# Patient Record
Sex: Male | Born: 1950 | Race: Black or African American | Hispanic: No | Marital: Married | Smoking: Current every day smoker
Health system: Southern US, Community
[De-identification: ages and names within clinical notes are randomized; demographics above are authoritative.]

## PROBLEM LIST (undated history)

## (undated) DIAGNOSIS — B1920 Unspecified viral hepatitis C without hepatic coma: Secondary | ICD-10-CM

## (undated) DIAGNOSIS — N289 Disorder of kidney and ureter, unspecified: Secondary | ICD-10-CM

## (undated) DIAGNOSIS — M25569 Pain in unspecified knee: Secondary | ICD-10-CM

## (undated) DIAGNOSIS — C220 Liver cell carcinoma: Secondary | ICD-10-CM

## (undated) DIAGNOSIS — K769 Liver disease, unspecified: Secondary | ICD-10-CM

## (undated) DIAGNOSIS — I10 Essential (primary) hypertension: Secondary | ICD-10-CM

---

## 2002-05-23 ENCOUNTER — Encounter: Payer: Self-pay | Admitting: Emergency Medicine

## 2002-05-23 ENCOUNTER — Emergency Department (HOSPITAL_COMMUNITY): Admission: EM | Admit: 2002-05-23 | Discharge: 2002-05-23 | Payer: Self-pay | Admitting: Emergency Medicine

## 2009-11-07 ENCOUNTER — Emergency Department (HOSPITAL_COMMUNITY): Admission: EM | Admit: 2009-11-07 | Discharge: 2009-11-07 | Payer: Self-pay | Admitting: Emergency Medicine

## 2011-09-23 ENCOUNTER — Emergency Department (HOSPITAL_COMMUNITY): Payer: Non-veteran care

## 2011-09-23 ENCOUNTER — Inpatient Hospital Stay (HOSPITAL_COMMUNITY)
Admission: EM | Admit: 2011-09-23 | Discharge: 2011-09-28 | DRG: 505 | Disposition: A | Discharge status: Home Health | Payer: Non-veteran care | Attending: Internal Medicine | Admitting: Internal Medicine

## 2011-09-23 ENCOUNTER — Encounter: Payer: Self-pay | Admitting: *Deleted

## 2011-09-23 DIAGNOSIS — M109 Gout, unspecified: Secondary | ICD-10-CM | POA: Diagnosis present

## 2011-09-23 DIAGNOSIS — R52 Pain, unspecified: Secondary | ICD-10-CM

## 2011-09-23 DIAGNOSIS — F1210 Cannabis abuse, uncomplicated: Secondary | ICD-10-CM | POA: Diagnosis present

## 2011-09-23 DIAGNOSIS — F17200 Nicotine dependence, unspecified, uncomplicated: Secondary | ICD-10-CM | POA: Diagnosis present

## 2011-09-23 DIAGNOSIS — R7401 Elevation of levels of liver transaminase levels: Secondary | ICD-10-CM | POA: Diagnosis present

## 2011-09-23 DIAGNOSIS — F1010 Alcohol abuse, uncomplicated: Secondary | ICD-10-CM | POA: Diagnosis present

## 2011-09-23 DIAGNOSIS — M21969 Unspecified acquired deformity of unspecified lower leg: Secondary | ICD-10-CM | POA: Diagnosis present

## 2011-09-23 DIAGNOSIS — K709 Alcoholic liver disease, unspecified: Secondary | ICD-10-CM | POA: Diagnosis present

## 2011-09-23 DIAGNOSIS — M869 Osteomyelitis, unspecified: Principal | ICD-10-CM | POA: Diagnosis present

## 2011-09-23 DIAGNOSIS — B1920 Unspecified viral hepatitis C without hepatic coma: Secondary | ICD-10-CM | POA: Diagnosis present

## 2011-09-23 DIAGNOSIS — K7689 Other specified diseases of liver: Secondary | ICD-10-CM | POA: Diagnosis present

## 2011-09-23 DIAGNOSIS — M25569 Pain in unspecified knee: Secondary | ICD-10-CM

## 2011-09-23 DIAGNOSIS — K769 Liver disease, unspecified: Secondary | ICD-10-CM | POA: Diagnosis present

## 2011-09-23 DIAGNOSIS — M1710 Unilateral primary osteoarthritis, unspecified knee: Secondary | ICD-10-CM | POA: Diagnosis present

## 2011-09-23 DIAGNOSIS — I10 Essential (primary) hypertension: Secondary | ICD-10-CM | POA: Diagnosis present

## 2011-09-23 DIAGNOSIS — R7402 Elevation of levels of lactic acid dehydrogenase (LDH): Secondary | ICD-10-CM | POA: Diagnosis present

## 2011-09-23 HISTORY — DX: Pain in unspecified knee: M25.569

## 2011-09-23 HISTORY — DX: Essential (primary) hypertension: I10

## 2011-09-23 HISTORY — DX: Disorder of kidney and ureter, unspecified: N28.9

## 2011-09-23 HISTORY — DX: Unspecified viral hepatitis C without hepatic coma: B19.20

## 2011-09-23 HISTORY — DX: Liver disease, unspecified: K76.9

## 2011-09-23 LAB — DIFFERENTIAL
Basophils Absolute: 0 10*3/uL (ref 0.0–0.1)
Eosinophils Absolute: 0.1 10*3/uL (ref 0.0–0.7)
Lymphocytes Relative: 17 % (ref 12–46)
Lymphs Abs: 1.5 10*3/uL (ref 0.7–4.0)
Monocytes Relative: 7 % (ref 3–12)
Neutro Abs: 6.9 10*3/uL (ref 1.7–7.7)

## 2011-09-23 LAB — CBC
HCT: 37.5 % — ABNORMAL LOW (ref 39.0–52.0)
HCT: 35.4 % — ABNORMAL LOW (ref 39.0–52.0)
Hemoglobin: 12.1 g/dL — ABNORMAL LOW (ref 13.0–17.0)
MCHC: 33.6 g/dL (ref 30.0–36.0)
Platelets: 107 10*3/uL — ABNORMAL LOW (ref 150–400)
RBC: 3.45 MIL/uL — ABNORMAL LOW (ref 4.22–5.81)
RDW: 14.5 % (ref 11.5–15.5)
WBC: 9.1 10*3/uL (ref 4.0–10.5)
WBC: 9.8 10*3/uL (ref 4.0–10.5)

## 2011-09-23 LAB — COMPREHENSIVE METABOLIC PANEL
Albumin: 2.8 g/dL — ABNORMAL LOW (ref 3.5–5.2)
BUN: 17 mg/dL (ref 6–23)
Calcium: 8.8 mg/dL (ref 8.4–10.5)
GFR calc Af Amer: >90 mL/min (ref >90)
Glucose, Bld: 178 mg/dL — ABNORMAL HIGH (ref 70–99)
Potassium: 4 mEq/L (ref 3.5–5.1)
Total Protein: 7.4 g/dL (ref 6.0–8.3)

## 2011-09-23 LAB — CREATININE, SERUM
Creatinine, Ser: 1 mg/dL (ref 0.50–1.35)
GFR calc Af Amer: >90 mL/min (ref >90)
GFR calc non Af Amer: 80 mL/min — ABNORMAL LOW (ref >90)

## 2011-09-23 LAB — SEDIMENTATION RATE: Sed Rate: 30 mm/hr — ABNORMAL HIGH (ref 0–16)

## 2011-09-23 MED ORDER — LORAZEPAM 2 MG/ML IJ SOLN
1.0000 mg | Freq: Four times a day (QID) | INTRAMUSCULAR | Status: DC | PRN
  Administered 2011-09-23 – 2011-09-26 (×8): 1 mg via INTRAVENOUS
  Filled 2011-09-23 (×9): qty 1

## 2011-09-23 MED ORDER — AMLODIPINE BESYLATE 10 MG PO TABS
10.0000 mg | ORAL_TABLET | ORAL | Status: DC
  Filled 2011-09-23: qty 1

## 2011-09-23 MED ORDER — PIPERACILLIN-TAZOBACTAM 3.375 G IVPB
3.3750 g | Freq: Three times a day (TID) | INTRAVENOUS | Status: DC
  Filled 2011-09-23 (×2): qty 50

## 2011-09-23 MED ORDER — PIPERACILLIN-TAZOBACTAM 3.375 G IVPB
3.3750 g | Freq: Three times a day (TID) | INTRAVENOUS | Status: DC
  Administered 2011-09-24 – 2011-09-28 (×14): 3.375 g via INTRAVENOUS
  Filled 2011-09-23 (×15): qty 50

## 2011-09-23 MED ORDER — SPIRONOLACTONE 25 MG PO TABS
25.0000 mg | ORAL_TABLET | ORAL | Status: AC
  Filled 2011-09-23: qty 1

## 2011-09-23 MED ORDER — COLCHICINE 0.6 MG PO TABS
0.6000 mg | ORAL_TABLET | Freq: Two times a day (BID) | ORAL | Status: AC
  Administered 2011-09-23 – 2011-09-28 (×10): 0.6 mg via ORAL
  Filled 2011-09-23 (×9): qty 1

## 2011-09-23 MED ORDER — SODIUM CHLORIDE 0.9 % IV SOLN
Freq: Once | INTRAVENOUS | Status: AC
  Administered 2011-09-23: 16:00:00 via INTRAVENOUS

## 2011-09-23 MED ORDER — SODIUM CHLORIDE 0.9 % IJ SOLN
3.0000 mL | INTRAMUSCULAR | Status: DC | PRN
  Administered 2011-09-23: 3 mL via INTRAVENOUS

## 2011-09-23 MED ORDER — AMLODIPINE BESYLATE 10 MG PO TABS
10.0000 mg | ORAL_TABLET | Freq: Every day | ORAL | Status: DC
  Administered 2011-09-23 – 2011-09-28 (×6): 10 mg via ORAL
  Filled 2011-09-23 (×6): qty 1

## 2011-09-23 MED ORDER — VANCOMYCIN HCL IN DEXTROSE 1-5 GM/200ML-% IV SOLN
1000.0000 mg | Freq: Two times a day (BID) | INTRAVENOUS | Status: DC
  Administered 2011-09-24 – 2011-09-27 (×7): 1000 mg via INTRAVENOUS
  Filled 2011-09-23 (×10): qty 200

## 2011-09-23 MED ORDER — PREDNISONE 50 MG PO TABS
60.0000 mg | ORAL_TABLET | Freq: Every day | ORAL | Status: DC
  Administered 2011-09-24: 60 mg via ORAL
  Filled 2011-09-23: qty 1

## 2011-09-23 MED ORDER — VANCOMYCIN HCL IN DEXTROSE 1-5 GM/200ML-% IV SOLN
1000.0000 mg | INTRAVENOUS | Status: AC
  Administered 2011-09-23: 1000 mg via INTRAVENOUS
  Filled 2011-09-23: qty 200

## 2011-09-23 MED ORDER — FOLIC ACID 1 MG PO TABS
1.0000 mg | ORAL_TABLET | Freq: Every day | ORAL | Status: DC
  Administered 2011-09-23 – 2011-09-28 (×6): 1 mg via ORAL
  Filled 2011-09-23 (×7): qty 1

## 2011-09-23 MED ORDER — SPIRONOLACTONE 25 MG PO TABS
25.0000 mg | ORAL_TABLET | Freq: Every day | ORAL | Status: DC
  Administered 2011-09-23 – 2011-09-24 (×2): 25 mg via ORAL
  Filled 2011-09-23: qty 1

## 2011-09-23 MED ORDER — ONDANSETRON HCL 4 MG/2ML IJ SOLN
4.0000 mg | Freq: Four times a day (QID) | INTRAMUSCULAR | Status: DC | PRN
  Administered 2011-09-23 (×2): 4 mg via INTRAVENOUS
  Filled 2011-09-23 (×2): qty 2

## 2011-09-23 MED ORDER — NAPROXEN 500 MG PO TABS
500.0000 mg | ORAL_TABLET | Freq: Two times a day (BID) | ORAL | Status: DC
  Administered 2011-09-24 – 2011-09-28 (×9): 500 mg via ORAL
  Filled 2011-09-23 (×14): qty 1

## 2011-09-23 MED ORDER — MORPHINE SULFATE 2 MG/ML IJ SOLN
1.0000 mg | INTRAMUSCULAR | Status: AC | PRN
  Administered 2011-09-23 – 2011-09-25 (×8): 1 mg via INTRAVENOUS
  Filled 2011-09-23 (×8): qty 1

## 2011-09-23 MED ORDER — PIPERACILLIN-TAZOBACTAM 3.375 G IVPB 30 MIN
3.3750 g | INTRAVENOUS | Status: AC
  Administered 2011-09-23: 3.375 g via INTRAVENOUS
  Filled 2011-09-23: qty 50

## 2011-09-23 MED ORDER — HEPARIN SODIUM (PORCINE) 5000 UNIT/ML IJ SOLN
5000.0000 [IU] | Freq: Three times a day (TID) | INTRAMUSCULAR | Status: DC
  Administered 2011-09-24 – 2011-09-26 (×8): 5000 [IU] via SUBCUTANEOUS
  Filled 2011-09-23 (×11): qty 1

## 2011-09-23 MED ORDER — CARVEDILOL 6.25 MG PO TABS
6.2500 mg | ORAL_TABLET | Freq: Two times a day (BID) | ORAL | Status: DC
  Administered 2011-09-24: 6.25 mg via ORAL
  Filled 2011-09-23 (×2): qty 1

## 2011-09-23 MED ORDER — COLCHICINE 0.6 MG PO TABS
0.6000 mg | ORAL_TABLET | ORAL | Status: DC
  Filled 2011-09-23: qty 1

## 2011-09-23 MED ORDER — MORPHINE SULFATE 4 MG/ML IJ SOLN
4.0000 mg | Freq: Once | INTRAMUSCULAR | Status: AC
  Administered 2011-09-23: 4 mg via INTRAVENOUS
  Filled 2011-09-23: qty 1

## 2011-09-23 MED ORDER — VITAMIN B-1 100 MG PO TABS
100.0000 mg | ORAL_TABLET | Freq: Every day | ORAL | Status: DC
  Administered 2011-09-23 – 2011-09-28 (×6): 100 mg via ORAL
  Filled 2011-09-23 (×7): qty 1

## 2011-09-23 NOTE — ED Notes (Signed)
Pt states he was seen last month at [facility] for same symptoms to left foot. Was treated with IV antibiotics, "it started to heal and everything looked fine. Then the pain and swelling came back". Pt states "at one point they said they would cut my toe off, but they saved it"

## 2011-09-23 NOTE — ED Provider Notes (Signed)
History     CSN: [id number] Arrival date & time: 09/23/2011  1:06 PM   First MD Initiated Contact with Patient 09/23/11 1458      Chief Complaint  Patient presents with  . Toe Pain  . Foot Pain    (Consider location/radiation/quality/duration/timing/severity/associated sxs/prior treatment) HPI  Patient relates he started having gallops in his left foot about 2 weeks ago he states last week his second toe started swelling and split open. He relates now he has swelling and pain of his whole foot. He states something similar happened last year and he was admitted to the hospital for IV antibiotics at the [facility] for over a week. He denies fever or chills, any states he has a lot of pain when he tries to walk on it. He relates they almost had to amputate his toe however they were able to save it. He normally walks with a cane because he needs bilateral knee replacement and he still using a cane to walk.  Past Medical History  Diagnosis Date  . Hypertension   . Gout   . Hepatitis C     History reviewed. No pertinent past surgical history.  Family History  Problem Relation Age of Onset  . Diabetes Mother   . Hypertension Mother   . Diabetes Father   . Hypertension Father     History  Substance Use Topics  . Smoking status: Current Everyday Smoker  . Smokeless tobacco: Not on file  . Alcohol Use: 0.6 oz/week    1 Cans of beer per day   Patient on disability for gallop   Review of Systems  All other systems reviewed and are negative.    Allergies  Review of patient's allergies indicates no known allergies.  Home Medications   Current Outpatient Rx  Name Route Sig Dispense Refill  . NAPROXEN 500 MG PO TABS Oral Take 500 mg by mouth 2 (two) times daily with a meal.      . PREDNISONE (PAK) 10 MG PO TABS Oral Take 10 mg by mouth daily.        BP 170/97  Pulse 64  Temp(Src) 98.1 F (36.7 C) (Oral)  Resp 16  SpO2 96%  Physical Exam  Constitutional: He is  oriented to person, place, and time. He appears well-developed and well-nourished.  HENT:  Head: Normocephalic and atraumatic.  Eyes: Conjunctivae are normal. Pupils are equal, round, and reactive to light.  Musculoskeletal:       Patient is noted to have diffuse swelling of the dorsum of his left foot. He also had swelling and some redness over the MTP joint of his left great toe consistent with gout. His second toe is extremely enlarged and swollen with darkish discoloration with open wound on the dorsum of the toe that is red in color there is no active draining now but he states it has been draining purulent material.  Neurological: He is alert and oriented to person, place, and time.  Skin: Skin is warm and dry.    ED Course  Procedures (including critical care time)  Pt given IV fluids and IV pain and nausea meds.  17:46 Dr Eda Paschal, admit to team 4, medical bed.   Results for orders placed during the hospital encounter of 09/23/11  CBC      Component Value Range   WBC 9.1  4.0 - 10.5 (K/uL)   RBC 3.64 (*) 4.22 - 5.81 (MIL/uL)   Hemoglobin 12.6 (*) 13.0 - 17.0 (g/dL)  HCT 37.5 (*) 39.0 - 52.0 (%)   MCV 103.0 (*) 78.0 - 100.0 (fL)   MCH 34.6 (*) 26.0 - 34.0 (pg)   MCHC 33.6  30.0 - 36.0 (g/dL)   RDW 16.1  09.6 - 04.5 (%)   Platelets 107 (*) 150 - 400 (K/uL)  DIFFERENTIAL      Component Value Range   Neutrophils Relative 75  43 - 77 (%)   Lymphocytes Relative 17  12 - 46 (%)   Monocytes Relative 7  3 - 12 (%)   Eosinophils Relative 1  0 - 5 (%)   Basophils Relative 0  0 - 1 (%)   Neutro Abs 6.9  1.7 - 7.7 (K/uL)   Lymphs Abs 1.5  0.7 - 4.0 (K/uL)   Monocytes Absolute 0.6  0.1 - 1.0 (K/uL)   Eosinophils Absolute 0.1  0.0 - 0.7 (K/uL)   Basophils Absolute 0.0  0.0 - 0.1 (K/uL)   RBC Morphology TARGET CELLS    URIC ACID      Component Value Range   Uric Acid, Serum 9.5 (*) 4.0 - 7.8 (mg/dL)  COMPREHENSIVE METABOLIC PANEL      Component Value Range   Sodium 137  135 -  145 (mEq/L)   Potassium 4.0  3.5 - 5.1 (mEq/L)   Chloride 102  96 - 112 (mEq/L)   CO2 30  19 - 32 (mEq/L)   Glucose, Bld 178 (*) 70 - 99 (mg/dL)   BUN 17  6 - 23 (mg/dL)   Creatinine, Ser 4.09  0.50 - 1.35 (mg/dL)   Calcium 8.8  8.4 - 81.1 (mg/dL)   Total Protein 7.4  6.0 - 8.3 (g/dL)   Albumin 2.8 (*) 3.5 - 5.2 (g/dL)   AST 56 (*) 0 - 37 (U/L)   ALT 46  0 - 53 (U/L)   Alkaline Phosphatase 157 (*) 39 - 117 (U/L)   Total Bilirubin 1.0  0.3 - 1.2 (mg/dL)   GFR calc non Af Amer 89 (*) >90 (mL/min)   GFR calc Af Amer >90  >90 (mL/min)  SEDIMENTATION RATE      Component Value Range   Sed Rate 30 (*) 0 - 16 (mm/hr)    Laboratory interpretation mild anemia, thrombocytopenia, elevated uric acid, mildly elevated SED rate   Dg Toe 2nd Left  09/23/2011  *RADIOLOGY REPORT*  Clinical Data: Borderline diabetic.  Infected toe.  Question osteomyelitis?  LEFT TOE - 2+ VIEW  Comparison: None.  Findings: Destruction of the left second proximal interphalangeal joint space consistent with osteomyelitis.  IMPRESSION: Destruction of the left second proximal interphalangeal joint space consistent with osteomyelitis.  Original Report Authenticated By: Fuller Canada, M.D.    Diagnoses that have been ruled out:  Diagnoses that are still under consideration:  Final diagnoses:  Osteomyelitis of toe   Plan  Admit Devoria Albe, MD, FACEP    MDM          Ward Givens, MD 09/24/11 [phone]

## 2011-09-23 NOTE — ED Notes (Signed)
Pt a/o x 4, resting quietly, skin warm and dry, respirations even and unlabored, no acute distress noted, no needs identified at this time, awaiting bed placement. Remains with pain 6-7/10 to left foot, post op shoe applied for ambulation purposes

## 2011-09-23 NOTE — ED Notes (Signed)
Pt states feels a lot better; iv zosyn in progress; report called to tel floor

## 2011-09-23 NOTE — ED Notes (Signed)
Pt resting quietly; medicated with morhpine for pain 10/10; rates 7/10 post dose; vancomycin started via guardrails pump; pt given grilled cheese sandwich/grilled chicken salad/chef salad from kitchen

## 2011-09-23 NOTE — H&P (Signed)
[facility]  Chief Complaint:  Left foot swelling and pain for over 2 weeks  HPI: this is 60 year old male with varus deformity of the food and gouty arthritis , he has history of left foot ulcer previously about 2 years ago treated with iv antibiotics ,and it well healed ,apparently there was plan to amputate that toe but it healed by itself. Patient admitted for the last year ,he has more frequent blistering , treated with po Antibiotics, last treatment 3 weeks ago , patient has history of severe gout. For the last 2 weeks Patient relates  having swelling in his left foot ,he states last week his second toe started swelling and split open. He relates now he has swelling and pain of his whole foot.Marland Kitchen He denies fever or chills, any states he has a lot of pain when he tries to walk on it. He relates they almost had to amputate his toe however they were able to save it. He normally walks with a cane because he needs bilateral knee replacement and he still using a cane to walk.   Review of Systems:  The patient denies anorexia, fever, weight loss,, vision loss, decreased hearing, hoarseness, chest pain, syncope, dyspnea on exertion, peripheral edema, balance deficits, hemoptysis, abdominal pain, melena, hematochezia, severe indigestion/heartburn, hematuria, incontinence, genital sores, muscle weakness, suspicious skin lesions, transient blindness, difficulty walking, depression, unusual weight change, abnormal bleeding, enlarged lymph nodes, angioedema, and breast masses.  Past Medical History: Past Medical History  Diagnosis Date  . Hypertension   . Gout   . Hepatitis C   . Liver disorder     found 2 spots  4 years ago, last report "pretty good"  . Kidney disease     1 spot on kidney, stable at last check 9 months ago  . Knee joint arthropathy 09/23/11        Medications: Prior to Admission medications   Medication Sig Start Date End Date Taking? Authorizing Provider  naproxen  (NAPROSYN) 500 MG tablet Take 500 mg by mouth 2 (two) times daily with a meal.     Yes Historical Provider, MD  predniSONE (STERAPRED UNI-PAK) 10 MG tablet Take 10 mg by mouth daily.     Yes Historical Provider, MD  Coreg 6.25 mg po bid Aldactone 25 mg po  protonix 40 mg daily   Allergies:  No Known Allergies  Social History:  ongoing tobacco abuse for more than 40 years ago , quit 66month ago but relapse again, . He reports that he drinks about .6 ounces of alcohol per day. He reports that he uses illicit drugs marjuana  Family History: Family History  Problem Relation Age of Onset  . Diabetes Mother   . Hypertension Mother   . Diabetes Father   . Hypertension Father     Physical Exam: Filed Vitals:   09/23/11 1337 09/23/11 1624 09/23/11 1950  BP: 170/97 163/98 156/90  Pulse: 64 74 70  Temp: 98.1 F (36.7 C) 97.9 F (36.6 C)   TempSrc: Oral Oral   Resp: 16 18 20   SpO2: 96% 100% 99%   General appearance: alert, cooperative, appears stated age and no distress Head: Normocephalic, without obvious abnormality, atraumatic Ears: normal TM's and external ear canals both ears Throat: lips, mucosa, and tongue normal; teeth and gums normal Neck: no adenopathy, no carotid bruit, no JVD, supple, symmetrical, trachea midline and thyroid not enlarged, symmetric, no tenderness/mass/nodules Resp: clear to auscultation bilaterally Breasts: normal appearance, no  masses or tenderness Cardio: regular rate and rhythm, S1, S2 normal, no murmur, click, rub or gallop GI: soft, non-tender; bowel sounds normal; no masses,  no organomegaly Extremities: Homans sign is negative, no sign of DVT, has bilateral oedema, also has  varus deformity of the left foot with ulceration and swelling of the left second big toe ,pulses intact. Pulses: 2+ and symmetric Skin: Skin color, texture, turgor normal. No rashes or lesions Lymph nodes: Cervical, supraclavicular, and axillary nodes normal. Neurologic: Alert  and oriented X 3, normal strength and tone. Normal symmetric reflexes. Normal coordination and gait   Labs on Admission:   [facility] 09/23/11 1530  NA 137  K 4.0  CL 102  CO2 30  GLUCOSE 178*  BUN 17  CREATININE 0.97  CALCIUM 8.8  MG --  PHOS --    Basename 09/23/11 1530  AST 56*  ALT 46  ALKPHOS 157*  BILITOT 1.0  PROT 7.4  ALBUMIN 2.8*   No results found for this basename: LIPASE:2,AMYLASE:2 in the last 72 hours  Basename 09/23/11 1530  WBC 9.1  NEUTROABS 6.9  HGB 12.6*  HCT 37.5*  MCV 103.0*  PLT 107*   Radiological Exams on Admission: Dg Toe 2nd Left  09/23/2011  *RADIOLOGY REPORT*  Clinical Data: Borderline diabetic.  Infected toe.  Question osteomyelitis?  LEFT TOE - 2+ VIEW  Comparison: None.  Findings: Destruction of the left second proximal interphalangeal joint space consistent with osteomyelitis.  IMPRESSION: Destruction of the left second proximal interphalangeal joint space consistent with osteomyelitis.  Original Report Authenticated By: Fuller Canada, M.D.    Assessment/Plan Present on Admission:  1-osteomyelitis of the second left toe will place on vancomycin and zosyn, history of gout will add naprosyn and prednisone , please consult orthopedic in am. 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 Abnormal LFT : AS out patient. .Foot deformity:per ortho Elevated CBG , CHECK HGBC1  Khrystyna Schwalm I. 09/23/2011, 8:16 PM

## 2011-09-23 NOTE — ED Notes (Signed)
Pt reports swelling and pain for 3 to 4 weeks. Pt reports he had antibiotics 2 weeks ago and toe began to improve. Pt reports now swelling and toe pain began again. Left second toe is swollen with open wound. Dorsal foot also swollen

## 2011-09-23 NOTE — Progress Notes (Signed)
ANTIBIOTIC CONSULT NOTE - INITIAL  Pharmacy Consult for Vancomycin Indication: Osteomyelitis of toe  No Known Allergies  Patient Measurements: Height: 6' (182.9 cm) Weight: 238 lb (107.956 kg) IBW/kg (Calculated) : 77.6    Vital Signs: Temp: 97.7 F (36.5 C) (11/09 2300) Temp src: Oral (11/09 2300) BP: 175/95 mmHg (11/09 2300) Pulse Rate: 63  (11/09 2300) Intake/Output from previous day:   Intake/Output from this shift:    Labs:  [facility] 09/23/11 2115 09/23/11 1530  WBC 9.8 9.1  HGB 12.1* 12.6*  PLT 100* 107*  LABCREA -- --  CREATININE 1.00 0.97   Estimated Creatinine Clearance: 101 ml/min (by C-G formula based on Cr of 1). No results found for this basename: VANCOTROUGH:2,VANCOPEAK:2,VANCORANDOM:2,GENTTROUGH:2,GENTPEAK:2,GENTRANDOM:2,TOBRATROUGH:2,TOBRAPEAK:2,TOBRARND:2,AMIKACINPEAK:2,AMIKACINTROU:2,AMIKACIN:2, in the last 72 hours   Microbiology: No results found for this or any previous visit (from the past 720 hour(s)).  Medical History: Past Medical History  Diagnosis Date  . Hypertension   . Gout   . Hepatitis C   . Liver disorder     found 2 spots  4 years ago, last report "pretty good"  . Kidney disease     1 spot on kidney, stable at last check 9 months ago  . Knee joint pain 09/23/11    needs replacements on both    Medications:  Scheduled:    . sodium chloride   Intravenous Once  . amLODipine  10 mg Oral Daily  . colchicine  0.6 mg Oral BID  . folic acid  1 mg Oral Daily  . heparin  5,000 Units Subcutaneous Q8H  .  morphine injection  4 mg Intravenous Once  . piperacillin-tazobactam  3.375 g Intravenous To ER  . piperacillin-tazobactam (ZOSYN)  IV  3.375 g Intravenous Q8H  . spironolactone  25 mg Oral Daily  . spironolactone  25 mg Oral To ER  . thiamine  100 mg Oral Daily  . vancomycin  1,000 mg Intravenous To ER  . DISCONTD: amLODipine  10 mg Oral To ER  . DISCONTD: colchicine  0.6 mg Oral To ER  . DISCONTD: piperacillin-tazobactam  (ZOSYN)  IV  3.375 g Intravenous Q8H   Infusions:   Assessment: 60 year old male with h/o left foot ulcer.  IV Vancomycin and Zosyn to begin empirically for treatment of osteomyelitis of the 2nd left toe.  Patient received Vancomycin 1gm IV x 1 in ED @ 21:21.  Goal of Therapy:  Vancomycin trough level 15-20 mcg/ml   Plan:  Vancomycin 1gm IV q12h.  Check vancomycin trough when appropriate.  Pearlena Ow, Joselyn Glassman 09/23/2011,11:37 PM

## 2011-09-23 NOTE — ED Notes (Signed)
Pt a/o x 4, resting quietly, skin warm and dry, respirations even and unlabored, no acute distress noted, no needs identified at this time, awaiting disposition  

## 2011-09-24 LAB — COMPREHENSIVE METABOLIC PANEL
ALT: 38 U/L (ref 0–53)
AST: 53 U/L — ABNORMAL HIGH (ref 0–37)
Alkaline Phosphatase: 139 U/L — ABNORMAL HIGH (ref 39–117)
CO2: 27 mEq/L (ref 19–32)
Chloride: 105 mEq/L (ref 96–112)
Creatinine, Ser: 0.97 mg/dL (ref 0.50–1.35)
GFR calc non Af Amer: 89 mL/min — ABNORMAL LOW (ref >90)
Potassium: 3.9 mEq/L (ref 3.5–5.1)
Sodium: 136 mEq/L (ref 135–145)
Total Bilirubin: 0.4 mg/dL (ref 0.3–1.2)

## 2011-09-24 MED ORDER — PANTOPRAZOLE SODIUM 40 MG PO TBEC
40.0000 mg | DELAYED_RELEASE_TABLET | Freq: Every day | ORAL | Status: DC
  Administered 2011-09-24 – 2011-09-28 (×5): 40 mg via ORAL
  Filled 2011-09-24 (×6): qty 1

## 2011-09-24 MED ORDER — CARVEDILOL 12.5 MG PO TABS
12.5000 mg | ORAL_TABLET | Freq: Two times a day (BID) | ORAL | Status: DC
  Administered 2011-09-24 – 2011-09-28 (×9): 12.5 mg via ORAL
  Filled 2011-09-24 (×12): qty 1

## 2011-09-24 MED ORDER — MAGNESIUM OXIDE 400 MG PO TABS
400.0000 mg | ORAL_TABLET | Freq: Every day | ORAL | Status: DC
  Administered 2011-09-24 – 2011-09-28 (×5): 400 mg via ORAL
  Filled 2011-09-24 (×6): qty 1

## 2011-09-24 MED ORDER — PREDNISONE 10 MG PO TABS
10.0000 mg | ORAL_TABLET | Freq: Every day | ORAL | Status: DC
  Administered 2011-09-25 – 2011-09-26 (×2): 10 mg via ORAL
  Filled 2011-09-24 (×2): qty 1

## 2011-09-24 NOTE — Consult Note (Signed)
Reason for Consult:Left 2nd toe osteo Referring Physician: Venetia Constable  HPI: Bryan Reyes is an 60 y.o. male he has history of left foot ulcer previously about 2 years ago treated with iv antibiotics ,and it well healed ,apparently there was plan to amputate that toe but it healed by itself. Patient admitted for the last year ,he has more frequent blistering , treated with po Antibiotics, last treatment 3 weeks ago , patient has history of severe gout. For the last 2 weeks  Patient relates having swelling in his left foot ,he states last week his second toe started swelling and split open. He relates now he has swelling and pain of his whole foot.Marland Kitchen He denies fever or chills, any states he has a lot of pain when he tries to walk on it. He relates they almost had to amputate his toe however they were able to save it. He normally walks with a cane because he needs bilateral knee replacement and he still using a cane to walk.  Patient reports that he was evaluated at [facility] in the past and told that he might need an amputation.    Past Medical History  Diagnosis Date  . Hypertension   . Gout   . Hepatitis C   . Liver disorder     found 2 spots  4 years ago, last report "pretty good"  . Kidney disease     1 spot on kidney, stable at last check 9 months ago  . Knee joint pain 09/23/11    needs replacements on both    History reviewed. No pertinent past surgical history.  Family History  Problem Relation Age of Onset  . Diabetes Mother   . Hypertension Mother   . Diabetes Father   . Hypertension Father     Social History:  reports that he has been smoking.  He has never used smokeless tobacco. He reports that he drinks about .6 ounces of alcohol per week. He reports that he uses illicit drugs.  Allergies: No Known Allergies  Medications:  Prior to Admission:  Prescriptions prior to admission  Medication Sig Dispense Refill  . amLODipine (NORVASC) 10 MG tablet Take 10 mg by mouth daily.         . carvedilol (COREG) 6.25 MG tablet Take 6.25 mg by mouth 2 (two) times daily with a meal.        . furosemide (LASIX) 20 MG tablet Take 20 mg by mouth daily.        Marland Kitchen ketoconazole (NIZORAL) 2 % cream Apply 1 application topically 2 (two) times daily as needed. Apply to itchy areas on legs/arms       . miconazole (MICOTIN) 2 % powder Apply 1 application topically 2 (two) times daily as needed. Apply to feet between toes but not to ulcer.       . Multiple Vitamins-Minerals (MULTIVITAMINS THER. W/MINERALS) TABS Take 1 tablet by mouth daily.        . naproxen (NAPROSYN) 500 MG tablet Take 500 mg by mouth 2 (two) times daily with a meal.        . omeprazole (PRILOSEC) 20 MG capsule Take 20 mg by mouth daily.        . predniSONE (STERAPRED UNI-PAK) 10 MG tablet Take 10 mg by mouth daily.        Marland Kitchen spironolactone (ALDACTONE) 25 MG tablet Take 50 mg by mouth daily.         Scheduled:   . amLODipine  10  mg Oral Daily  . carvedilol  12.5 mg Oral BID WC  . colchicine  0.6 mg Oral BID  . folic acid  1 mg Oral Daily  . heparin  5,000 Units Subcutaneous Q8H  . magnesium oxide  400 mg Oral Daily  . naproxen  500 mg Oral BID WC  . pantoprazole  40 mg Oral Q1200  . piperacillin-tazobactam  3.375 g Intravenous To ER  . piperacillin-tazobactam (ZOSYN)  IV  3.375 g Intravenous Q8H  . predniSONE  10 mg Oral QAC breakfast  . spironolactone  25 mg Oral To ER  . thiamine  100 mg Oral Daily  . vancomycin  1,000 mg Intravenous To ER  . vancomycin  1,000 mg Intravenous Q12H  . DISCONTD: amLODipine  10 mg Oral To ER  . DISCONTD: carvedilol  6.25 mg Oral BID WC  . DISCONTD: colchicine  0.6 mg Oral To ER  . DISCONTD: piperacillin-tazobactam (ZOSYN)  IV  3.375 g Intravenous Q8H  . DISCONTD: predniSONE  60 mg Oral QAC breakfast  . DISCONTD: spironolactone  25 mg Oral Daily   Continuous:   Results for orders placed during the hospital encounter of 09/23/11 (from the past 48 hour(s))  CBC     Status: Abnormal     Collection Time   09/23/11  3:30 PM      Component Value Range Comment   WBC 9.1  4.0 - 10.5 (K/uL)    RBC 3.64 (*) 4.22 - 5.81 (MIL/uL)    Hemoglobin 12.6 (*) 13.0 - 17.0 (g/dL)    HCT 16.1 (*) 09.6 - 52.0 (%)    MCV 103.0 (*) 78.0 - 100.0 (fL)    MCH 34.6 (*) 26.0 - 34.0 (pg)    MCHC 33.6  30.0 - 36.0 (g/dL)    RDW 04.5  40.9 - 81.1 (%)    Platelets 107 (*) 150 - 400 (K/uL)   DIFFERENTIAL     Status: Normal   Collection Time   09/23/11  3:30 PM      Component Value Range Comment   Neutrophils Relative 75  43 - 77 (%)    Lymphocytes Relative 17  12 - 46 (%)    Monocytes Relative 7  3 - 12 (%)    Eosinophils Relative 1  0 - 5 (%)    Basophils Relative 0  0 - 1 (%)    Neutro Abs 6.9  1.7 - 7.7 (K/uL)    Lymphs Abs 1.5  0.7 - 4.0 (K/uL)    Monocytes Absolute 0.6  0.1 - 1.0 (K/uL)    Eosinophils Absolute 0.1  0.0 - 0.7 (K/uL)    Basophils Absolute 0.0  0.0 - 0.1 (K/uL)    RBC Morphology TARGET CELLS     URIC ACID     Status: Abnormal   Collection Time   09/23/11  3:30 PM      Component Value Range Comment   Uric Acid, Serum 9.5 (*) 4.0 - 7.8 (mg/dL)   COMPREHENSIVE METABOLIC PANEL     Status: Abnormal   Collection Time   09/23/11  3:30 PM      Component Value Range Comment   Sodium 137  135 - 145 (mEq/L)    Potassium 4.0  3.5 - 5.1 (mEq/L)    Chloride 102  96 - 112 (mEq/L)    CO2 30  19 - 32 (mEq/L)    Glucose, Bld 178 (*) 70 - 99 (mg/dL)    BUN 17  6 - 23 (  mg/dL)    Creatinine, Ser 0.10  0.50 - 1.35 (mg/dL)    Calcium 8.8  8.4 - 10.5 (mg/dL)    Total Protein 7.4  6.0 - 8.3 (g/dL)    Albumin 2.8 (*) 3.5 - 5.2 (g/dL)    AST 56 (*) 0 - 37 (U/L)    ALT 46  0 - 53 (U/L)    Alkaline Phosphatase 157 (*) 39 - 117 (U/L)    Total Bilirubin 1.0  0.3 - 1.2 (mg/dL)    GFR calc non Af Amer 89 (*) >90 (mL/min)    GFR calc Af Amer >90  >90 (mL/min)   SEDIMENTATION RATE     Status: Abnormal   Collection Time   09/23/11  3:30 PM      Component Value Range Comment   Sed Rate 30 (*) 0  - 16 (mm/hr)   CBC     Status: Abnormal   Collection Time   09/23/11  9:15 PM      Component Value Range Comment   WBC 9.8  4.0 - 10.5 (K/uL)    RBC 3.45 (*) 4.22 - 5.81 (MIL/uL)    Hemoglobin 12.1 (*) 13.0 - 17.0 (g/dL)    HCT 27.2 (*) 53.6 - 52.0 (%)    MCV 102.6 (*) 78.0 - 100.0 (fL)    MCH 35.1 (*) 26.0 - 34.0 (pg)    MCHC 34.2  30.0 - 36.0 (g/dL)    RDW 64.4  03.4 - 74.2 (%)    Platelets 100 (*) 150 - 400 (K/uL)   CREATININE, SERUM     Status: Abnormal   Collection Time   09/23/11  9:15 PM      Component Value Range Comment   Creatinine, Ser 1.00  0.50 - 1.35 (mg/dL)    GFR calc non Af Amer 80 (*) >90 (mL/min)    GFR calc Af Amer >90  >90 (mL/min)   COMPREHENSIVE METABOLIC PANEL     Status: Abnormal   Collection Time   09/24/11  6:00 AM      Component Value Range Comment   Sodium 136  135 - 145 (mEq/L)    Potassium 3.9  3.5 - 5.1 (mEq/L)    Chloride 105  96 - 112 (mEq/L)    CO2 27  19 - 32 (mEq/L)    Glucose, Bld 92  70 - 99 (mg/dL)    BUN 16  6 - 23 (mg/dL)    Creatinine, Ser 5.95  0.50 - 1.35 (mg/dL)    Calcium 8.1 (*) 8.4 - 10.5 (mg/dL)    Total Protein 6.2  6.0 - 8.3 (g/dL)    Albumin 2.2 (*) 3.5 - 5.2 (g/dL)    AST 53 (*) 0 - 37 (U/L)    ALT 38  0 - 53 (U/L)    Alkaline Phosphatase 139 (*) 39 - 117 (U/L)    Total Bilirubin 0.4  0.3 - 1.2 (mg/dL)    GFR calc non Af Amer 89 (*) >90 (mL/min)    GFR calc Af Amer >90  >90 (mL/min)   PHOSPHORUS     Status: Normal   Collection Time   09/24/11  6:00 AM      Component Value Range Comment   Phosphorus 3.7  2.3 - 4.6 (mg/dL)     Dg Chest Portable 1 View  09/23/2011  *RADIOLOGY REPORT*  Clinical Data: Shortness of breath  PORTABLE CHEST - 1 VIEW  Comparison: None.  Findings: Lungs are clear. No pleural effusion or pneumothorax.  The heart is top normal in size.  IMPRESSION: No evidence of acute cardiopulmonary disease.  Original Report Authenticated By: Charline Bills, M.D.   Dg Toe 2nd Left  09/23/2011  *RADIOLOGY  REPORT*  Clinical Data: Borderline diabetic.  Infected toe.  Question osteomyelitis?  LEFT TOE - 2+ VIEW  Comparison: None.  Findings: Destruction of the left second proximal interphalangeal joint space consistent with osteomyelitis.  IMPRESSION: Destruction of the left second proximal interphalangeal joint space consistent with osteomyelitis.  Original Report Authenticated By: Fuller Canada, M.D.    ROS: The patient denies anorexia, fever, weight loss,, vision loss, decreased hearing, hoarseness, chest pain, syncope, dyspnea on exertion, peripheral edema, balance deficits, hemoptysis, abdominal pain, melena, hematochezia, severe indigestion/heartburn, hematuria, incontinence, genital sores, muscle weakness, suspicious skin lesions, transient blindness, difficulty walking, depression, unusual weight change, abnormal bleeding, enlarged lymph nodes, angioedema, and breast masses   Physical Exam: Orthopaedic exam focused to Left foot: diffuse swelling mid and forefoot, 2nd toe chronically swollen with 1cm dorsal ulceration with moderate foul smelling purulent drainage. 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 Will require 2nd toe amputation to hope to achieve healing. He understands and accepts and agrees with plan. Will tentatively plan for surgery early next week (hopefully Tuesday)  Nera Haworth M 09/24/2011, 5:09 PM

## 2011-09-24 NOTE — Progress Notes (Signed)
Bryan Reyes is a 60 y.o. male patient.  SUBJECTIVE This gentleman with history of gout, came in with pain and ulcer of the 2nd left toe. He noticed swelling and pain since about 5 days ago, then the skin "burst". Initially, he suspected just gout flare up. X-rays in ED suggested osteomyelitis, hence admission for further management. He says the swelling is better.   1. Osteomyelitis of toe     Past Medical History  Diagnosis Date  . Hypertension   . Gout   . Hepatitis C   . Liver disorder     found 2 spots  4 years ago, last report "pretty good"  . Kidney disease     1 spot on kidney, stable at last check 9 months ago  . Knee joint pain 09/23/11    needs replacements on both   Current Facility-Administered Medications  Medication Dose Route Frequency Provider Last Rate Last Dose  . amLODipine (NORVASC) tablet 10 mg  10 mg Oral Daily Hind I. Elsaid   10 mg at 09/24/11 1047  . carvedilol (COREG) tablet 12.5 mg  12.5 mg Oral BID WC Kadi Hession      . colchicine tablet 0.6 mg  0.6 mg Oral BID Hind I. Elsaid   0.6 mg at 09/24/11 1047  . folic acid (FOLVITE) tablet 1 mg  1 mg Oral Daily Hind I. Elsaid   1 mg at 09/24/11 1000  . heparin injection 5,000 Units  5,000 Units Subcutaneous Q8H Hind I. Elsaid   5,000 Units at 09/24/11 1400  . LORazepam (ATIVAN) injection 1 mg  1 mg Intravenous Q6H PRN Hind I. Elsaid   1 mg at 09/24/11 1313  . morphine 2 MG/ML injection 1 mg  1 mg Intravenous Q4H PRN Hind I. Elsaid   1 mg at 09/24/11 1051  . morphine 4 MG/ML injection 4 mg  4 mg Intravenous Once Ward Givens, MD   4 mg at 09/23/11 1630  . naproxen (NAPROSYN) tablet 500 mg  500 mg Oral BID WC Hind I. Elsaid   500 mg at 09/24/11 0816  . piperacillin-tazobactam (ZOSYN) IVPB 3.375 g  3.375 g Intravenous To ER Jacquenette Shone Crowford Darlina Guys., PHARMD   3.375 g at 09/23/11 2242  . piperacillin-tazobactam (ZOSYN) IVPB 3.375 g  3.375 g Intravenous Q8H Hind I. Elsaid   3.375 g at 09/24/11 1400  . predniSONE  (DELTASONE) tablet 10 mg  10 mg Oral QAC breakfast Dahmir Epperly      . sodium chloride 0.9 % injection 3 mL  3 mL Intravenous PRN Hind I. Elsaid   3 mL at 09/23/11 2114  . spironolactone (ALDACTONE) tablet 25 mg  25 mg Oral To ER Jacquenette Shone Crowford Darlina Guys., PHARMD      . thiamine (VITAMIN B-1) tablet 100 mg  100 mg Oral Daily Hind I. Elsaid   100 mg at 09/24/11 1054  . vancomycin (VANCOCIN) IVPB 1000 mg/200 mL premix  1,000 mg Intravenous To ER Hind I. Elsaid   1,000 mg at 09/23/11 2121  . vancomycin (VANCOCIN) IVPB 1000 mg/200 mL premix  1,000 mg Intravenous Q12H Leann Trefz Poindexter, PHARMD   1,000 mg at 09/24/11 1054  . DISCONTD: amLODipine (NORVASC) tablet 10 mg  10 mg Oral To ER Jacquenette Shone Crowford Darlina Guys., PHARMD      . DISCONTD: carvedilol (COREG) tablet 6.25 mg  6.25 mg Oral BID WC Hind I. Elsaid   6.25 mg at 09/24/11 0811  . DISCONTD: colchicine tablet 0.6 mg  0.6 mg Oral To ER Jacquenette Shone Crowford Darlina Guys., PHARMD      . DISCONTD: ondansetron [facility]) injection 4 mg  4 mg Intravenous Q6H PRN Ward Givens, MD   4 mg at 09/23/11 2118  . DISCONTD: piperacillin-tazobactam (ZOSYN) IVPB 3.375 g  3.375 g Intravenous Q8H Hind I. Elsaid      . DISCONTD: predniSONE (DELTASONE) tablet 60 mg  60 mg Oral QAC breakfast Hind I. Elsaid   60 mg at 09/24/11 0812  . DISCONTD: spironolactone (ALDACTONE) tablet 25 mg  25 mg Oral Daily Hind I. Elsaid   25 mg at 09/24/11 1048   No Known Allergies Active Problems:  Gout  HTN (hypertension)  ETOH abuse  Liver cell injury  Knee arthropathy  Foot deformity   Vital signs in last 24 hours: Temp:  [97.7 F (36.5 C)-98 F (36.7 C)] 97.9 F (36.6 C) (11/10 1314) Pulse Rate:  [57-74] 57  (11/10 1314) Resp:  [18-20] 18  (11/10 1314) BP: (151-181)/(86-101) 169/97 mmHg (11/10 1401) SpO2:  [94 %-100 %] 94 % (11/10 1314) Weight:  [107.956 kg (238 lb)] 238 lb (107.956 kg) (11/09 2300) Weight change:  Last BM Date: 09/24/11  Intake/Output from previous  day: 11/09 0701 - 11/10 0700 In: 890 [P.O.:840; IV Piggyback:50] Out: 350 [Urine:350] Intake/Output this shift: Total I/O In: 240 [P.O.:240] Out: -   Lab Results:  [facility] 09/23/11 2115 09/23/11 1530  WBC 9.8 9.1  HGB 12.1* 12.6*  HCT 35.4* 37.5*  PLT 100* 107*   BMET  Basename 09/24/11 0600 09/23/11 2115 09/23/11 1530  NA 136 -- 137  K 3.9 -- 4.0  CL 105 -- 102  CO2 27 -- 30  GLUCOSE 92 -- 178*  BUN 16 -- 17  CREATININE 0.97 1.00 --  CALCIUM 8.1* -- 8.8    Studies/Results: Dg Chest Portable 1 View  09/23/2011  *RADIOLOGY REPORT*  Clinical Data: Shortness of breath  PORTABLE CHEST - 1 VIEW  Comparison: None.  Findings: Lungs are clear. No pleural effusion or pneumothorax.  The heart is top normal in size.  IMPRESSION: No evidence of acute cardiopulmonary disease.  Original Report Authenticated By: Charline Bills, M.D.   Dg Toe 2nd Left  09/23/2011  *RADIOLOGY REPORT*  Clinical Data: Borderline diabetic.  Infected toe.  Question osteomyelitis?  LEFT TOE - 2+ VIEW  Comparison: None.  Findings: Destruction of the left second proximal interphalangeal joint space consistent with osteomyelitis.  IMPRESSION: Destruction of the left second proximal interphalangeal joint space consistent with osteomyelitis.  Original Report Authenticated By: Fuller Canada, M.D.    Medications: I have reviewed the patient's current medications.   Physical exam GENERAL- alert, well, happy, active and well-nourished HEAD- normal atraumatic, no neck masses, normal thyroid, no jvd RESPIRATORY- appears well, vitals normal, no respiratory distress, acyanotic, normal RR, ear and throat exam is normal, neck free of mass or lymphadenopathy, chest clear, no wheezing, crepitations, rhonchi, normal symmetric air entry CVS- regular rate and rhythm, S1, S2 normal, no murmur, click, rub or gallop ABDOMEN- abdomen is soft without significant tenderness, masses, organomegaly or guarding NEURO- Alert and  oriented X 3, normal strength and tone. Normal symmetric reflexes. Normal coordination and gait EXTREMITIES- clean fresh wound left second toe, which is also swollen and tender to palpation. No drainage.  Plan   1. Osteomyelitis left second toe/gout flare up- some apparent improvement in swelling and pain. Patient says he is ok if he has to lose toe to prevent infection from spreading. Agree  with antibiotics, steroids. Will consult orthopedics. Discussed with Dr Rennis Chris. He will graciously see patient.  2.  HTN (hypertension)- uncontrolled. Will increase lopressor.  3. ETOH abuse- No evidence of withdrawal, continue CIWA protocol, folate,thiamine. Will add magnesium oxide.  4.  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  5. Knee arthropathy- will eventually need knee replacement. Followed at the [place] in [place].  6  Foot deformity- No acute changes.  7. DVT/GI prophylaxis.    Brantlee Hinde 09/24/2011 3:52 PM Pager: [phone].

## 2011-09-25 LAB — MAGNESIUM: Magnesium: 1.8 mg/dL (ref 1.5–2.5)

## 2011-09-25 LAB — SEDIMENTATION RATE: Sed Rate: 16 mm/hr (ref 0–16)

## 2011-09-25 LAB — CBC
HCT: 32.7 % — ABNORMAL LOW (ref 39.0–52.0)
MCH: 35.4 pg — ABNORMAL HIGH (ref 26.0–34.0)
MCHC: 34.9 g/dL (ref 30.0–36.0)
MCV: 101.6 fL — ABNORMAL HIGH (ref 78.0–100.0)
Platelets: 98 10*3/uL — ABNORMAL LOW (ref 150–400)
RDW: 14.4 % (ref 11.5–15.5)

## 2011-09-25 LAB — COMPREHENSIVE METABOLIC PANEL
AST: 50 U/L — ABNORMAL HIGH (ref 0–37)
Albumin: 2.1 g/dL — ABNORMAL LOW (ref 3.5–5.2)
BUN: 16 mg/dL (ref 6–23)
Calcium: 8 mg/dL — ABNORMAL LOW (ref 8.4–10.5)
Creatinine, Ser: 0.98 mg/dL (ref 0.50–1.35)

## 2011-09-25 LAB — PHOSPHORUS: Phosphorus: 3.3 mg/dL (ref 2.3–4.6)

## 2011-09-25 MED ORDER — FLEET ENEMA 7-19 GM/118ML RE ENEM: 1.0000 | ENEMA | Freq: Once | RECTAL | Status: DC

## 2011-09-25 NOTE — Progress Notes (Signed)
Bryan Reyes 60 y.o. 09/23/2011   Lab. Results:  Basename 09/25/11 0520 09/23/11 2115  WBC 14.2* 9.8  HGB 11.4* 12.1*  HCT 32.7* 35.4*  PLT 98* 100*   BMET  Basename 09/25/11 0520 09/24/11 0600  NA 135 136  K 3.8 3.9  CL 104 105  CO2 24 27  GLUCOSE 124* 92  BUN 16 16  CREATININE 0.98 0.97  CALCIUM 8.0* 8.1*    No results found for this basename: inr   VITALS Filed Vitals:   09/25/11 0609  BP: 169/105  Pulse: 59  Temp: 97.3 F (36.3 C)  Resp: 18     Subjective Stable.  No new complaints  Objective Labs stable Vitals stable No SOB/CP Abd soft /NT Dressing: C/D/I  Assessment/ Plan Dr Victorino Dike to evaluate in AM Plan per Dr Victorino Dike Continue care   Julien Berryman D 11/11/20129:55 AM

## 2011-09-25 NOTE — Progress Notes (Signed)
Patient's BP elevated 172/112 HR-75, patient denies any distress. Anne Fu notified stated, continue to monitor and pt will get his BP medication later this AM.

## 2011-09-25 NOTE — ED Provider Notes (Signed)
Please note that on chart from 09/23/2011  notes containing the word "gallop" should read "gout".    Devoria Albe, MD, FACEP   Ward Givens, MD 09/25/11 1320

## 2011-09-26 LAB — SURGICAL PCR SCREEN: Staphylococcus aureus: NEGATIVE

## 2011-09-26 MED ORDER — HEPARIN SODIUM (PORCINE) 5000 UNIT/ML IJ SOLN: 5000.0000 [IU] | Freq: Three times a day (TID) | INTRAMUSCULAR | Status: DC

## 2011-09-26 MED ORDER — MORPHINE SULFATE 2 MG/ML IJ SOLN
1.0000 mg | INTRAMUSCULAR | Status: DC | PRN
  Administered 2011-09-26 – 2011-09-28 (×13): 1 mg via INTRAVENOUS
  Filled 2011-09-26 (×13): qty 1

## 2011-09-26 MED ORDER — CHLORHEXIDINE GLUCONATE 4 % EX LIQD
60.0000 mL | Freq: Once | CUTANEOUS | Status: DC
  Filled 2011-09-26: qty 60

## 2011-09-26 MED ORDER — LORAZEPAM 2 MG/ML IJ SOLN
2.0000 mg | INTRAMUSCULAR | Status: DC | PRN
  Administered 2011-09-26: 2 mg via INTRAVENOUS
  Filled 2011-09-26: qty 1

## 2011-09-26 MED ORDER — SODIUM CHLORIDE 0.9 % IV SOLN
INTRAVENOUS | Status: DC
  Administered 2011-09-26: 250 mL via INTRAVENOUS

## 2011-09-26 MED ORDER — CHLORHEXIDINE GLUCONATE 4 % EX LIQD
Freq: Once | CUTANEOUS | Status: DC
  Filled 2011-09-26: qty 118

## 2011-09-26 MED ORDER — LORAZEPAM 2 MG/ML PO CONC: 0.5000 mg | Freq: Four times a day (QID) | ORAL | Status: DC | PRN

## 2011-09-26 MED ORDER — LORAZEPAM 0.5 MG PO TABS
0.5000 mg | ORAL_TABLET | Freq: Four times a day (QID) | ORAL | Status: DC | PRN
  Administered 2011-09-27 – 2011-09-28 (×5): 0.5 mg via ORAL
  Filled 2011-09-26 (×5): qty 1

## 2011-09-26 MED ORDER — CHLORHEXIDINE GLUCONATE 4 % EX LIQD
Freq: Once | CUTANEOUS | Status: AC
  Administered 2011-09-27: 01:00:00 via TOPICAL
  Filled 2011-09-26: qty 15

## 2011-09-26 NOTE — Progress Notes (Signed)
Bryan Reyes is a 60 y.o. male patient.  SUBJECTIVE Ortho appreciated. Feels ok. No complaints.  1. Osteomyelitis of toe   2. Pain     Past Medical History  Diagnosis Date  . Hypertension   . Gout   . Hepatitis C   . Liver disorder     found 2 spots  4 years ago, last report "pretty good"  . Kidney disease     1 spot on kidney, stable at last check 9 months ago  . Knee joint pain 09/23/11    needs replacements on both   Current Facility-Administered Medications  Medication Dose Route Frequency Provider Last Rate Last Dose  . amLODipine (NORVASC) tablet 10 mg  10 mg Oral Daily Hind I. Elsaid   10 mg at 09/25/11 1000  . carvedilol (COREG) tablet 12.5 mg  12.5 mg Oral BID WC Yzabella Crunk   12.5 mg at 09/25/11 1741  . colchicine tablet 0.6 mg  0.6 mg Oral BID Hind I. Elsaid   0.6 mg at 09/25/11 2141  . folic acid (FOLVITE) tablet 1 mg  1 mg Oral Daily Hind I. Elsaid   1 mg at 09/25/11 1000  . heparin injection 5,000 Units  5,000 Units Subcutaneous Q8H Hind I. Elsaid   5,000 Units at 09/26/11 0654  . LORazepam (ATIVAN) injection 1 mg  1 mg Intravenous Q6H PRN Hind I. Elsaid   1 mg at 09/26/11 0046  . magnesium oxide (MAG-OX) tablet 400 mg  400 mg Oral Daily Jessyka Austria   400 mg at 09/25/11 1000  . morphine 2 MG/ML injection 1 mg  1 mg Intravenous Q4H PRN Hind I. Elsaid   1 mg at 09/25/11 1601  . morphine 2 MG/ML injection 1 mg  1 mg Intravenous Q4H PRN Mary A. Lynch   1 mg at 09/26/11 0350  . naproxen (NAPROSYN) tablet 500 mg  500 mg Oral BID WC Hind I. Elsaid   500 mg at 09/25/11 1842  . pantoprazole (PROTONIX) EC tablet 40 mg  40 mg Oral Q1200 Gearline Spilman   40 mg at 09/25/11 1135  . piperacillin-tazobactam (ZOSYN) IVPB 3.375 g  3.375 g Intravenous Q8H Hind I. Elsaid   3.375 g at 09/26/11 0654  . predniSONE (DELTASONE) tablet 10 mg  10 mg Oral QAC breakfast Shelma Eiben   10 mg at 09/25/11 0823  . sodium chloride 0.9 % injection 3 mL  3 mL Intravenous PRN Hind I. Elsaid   3  mL at 09/23/11 2114  . sodium phosphate (FLEET) 7-19 GM/118ML enema 1 enema  1 enema Rectal Once Dahari D Brooks      . thiamine (VITAMIN B-1) tablet 100 mg  100 mg Oral Daily Hind I. Elsaid   100 mg at 09/25/11 1000  . vancomycin (VANCOCIN) IVPB 1000 mg/200 mL premix  1,000 mg Intravenous Q12H Leann Trefz Poindexter, PHARMD   1,000 mg at 09/25/11 2142   No Known Allergies Active Problems:  Gout  HTN (hypertension)  ETOH abuse  Liver cell injury  Knee arthropathy  Foot deformity   Vital signs in last 24 hours: Temp:  [97.7 F (36.5 C)-97.8 F (36.6 C)] 97.7 F (36.5 C) (11/12 0648) Pulse Rate:  [61-72] 62  (11/12 0648) Resp:  [18] 18  (11/12 0648) BP: (136-153)/(82-98) 149/93 mmHg (11/12 0648) SpO2:  [93 %-98 %] 93 % (11/12 0648) Weight change:  Last BM Date: 09/24/11  Intake/Output from previous day: 11/11 0701 - 11/12 0700 In: 982.5 [P.O.:720; IV  Piggyback:162.5] Out: 1600 [Urine:1600] Intake/Output this shift:    Lab Results:  Basename 09/25/11 0520 09/23/11 2115  WBC 14.2* 9.8  HGB 11.4* 12.1*  HCT 32.7* 35.4*  PLT 98* 100*   BMET  Basename 09/25/11 0520 09/24/11 0600  NA 135 136  K 3.8 3.9  CL 104 105  CO2 24 27  GLUCOSE 124* 92  BUN 16 16  CREATININE 0.98 0.97  CALCIUM 8.0* 8.1*    Studies/Results: No results found.  Medications: I have reviewed the patient's current medications.   Physical exam GENERAL- alert, robust, well and happy HEAD- normal atraumatic, no neck masses, normal thyroid, no jvd RESPIRATORY- chest clear, no wheezing, crepitations, rhonchi, normal symmetric air entry CVS- regular rate and rhythm, S1, S2 normal, no murmur, click, rub or gallop ABDOMEN- abdomen is soft without significant tenderness, masses, organomegaly or guarding NEURO- Alert and oriented X 3, normal strength and tone. Normal symmetric reflexes. Normal coordination and gait EXTREMITIES- edema clean dressing left foot, no drainage.  Plan   1.  Osteomyelitis left second toe/gout flare up- doing ok. For surgery ?11/13. 2. HTN (hypertension)-  Better controlled.  3. ETOH abuse- No evidence of withdrawal, continue CIWA protocol, folate,thiamine. Will add magnesium oxide.  4. 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  5. Knee arthropathy- will eventually need knee replacement. Followed at the [place] in [place].  6 Foot deformity- No acute changes.  7. DVT/GI prophylaxis.   Okie Bogacz 09/26/2011 7:45 AM Pager: [phone].

## 2011-09-26 NOTE — Progress Notes (Signed)
Bryan Reyes is a 60 y.o. male patient.  SUBJECTIVE Feels ok. Says he has not rested much and wants something for anxiety. Ortho appreciated.   1. Osteomyelitis of toe   2. Pain     Past Medical History  Diagnosis Date  . Hypertension   . Gout   . Hepatitis C   . Liver disorder     found 2 spots  4 years ago, last report "pretty good"  . Kidney disease     1 spot on kidney, stable at last check 9 months ago  . Knee joint pain 09/23/11    needs replacements on both   Current Facility-Administered Medications  Medication Dose Route Frequency Provider Last Rate Last Dose  . 0.9 %  sodium chloride infusion   Intravenous Continuous Toni Arthurs, MD      . amLODipine (NORVASC) tablet 10 mg  10 mg Oral Daily Hind I. Elsaid   10 mg at 09/26/11 0909  . carvedilol (COREG) tablet 12.5 mg  12.5 mg Oral BID WC Jamicheal Heard   12.5 mg at 09/26/11 0909  . chlorhexidine (HIBICLENS) 4 % liquid 4 application  60 mL Topical Once Toni Arthurs, MD      . colchicine tablet 0.6 mg  0.6 mg Oral BID Hind I. Elsaid   0.6 mg at 09/26/11 0905  . folic acid (FOLVITE) tablet 1 mg  1 mg Oral Daily Hind I. Elsaid   1 mg at 09/26/11 0909  . heparin injection 5,000 Units  5,000 Units Subcutaneous Q8H Toni Arthurs, MD      . LORazepam (ATIVAN) injection 2 mg  2 mg Intravenous Q4H PRN Oracio Galen      . magnesium oxide (MAG-OX) tablet 400 mg  400 mg Oral Daily [place]   400 mg at 09/26/11 0909  . morphine 2 MG/ML injection 1 mg  1 mg Intravenous Q4H PRN Hind I. Elsaid   1 mg at 09/25/11 1601  . morphine 2 MG/ML injection 1 mg  1 mg Intravenous Q4H PRN Mary A. Lynch   1 mg at 09/26/11 0904  . naproxen (NAPROSYN) tablet 500 mg  500 mg Oral BID WC Hind I. Elsaid   500 mg at 09/26/11 0906  . pantoprazole (PROTONIX) EC tablet 40 mg  40 mg Oral Q1200 Maisen Klingler   40 mg at 09/25/11 1135  . piperacillin-tazobactam (ZOSYN) IVPB 3.375 g  3.375 g Intravenous Q8H Hind I. Elsaid   3.375 g at 09/26/11 0654  .  predniSONE (DELTASONE) tablet 10 mg  10 mg Oral QAC breakfast Manette Doto   10 mg at 09/26/11 0908  . sodium chloride 0.9 % injection 3 mL  3 mL Intravenous PRN Hind I. Elsaid   3 mL at 09/23/11 2114  . sodium phosphate (FLEET) 7-19 GM/118ML enema 1 enema  1 enema Rectal Once Dahari D Brooks      . thiamine (VITAMIN B-1) tablet 100 mg  100 mg Oral Daily Hind I. Elsaid   100 mg at 09/26/11 0909  . vancomycin (VANCOCIN) IVPB 1000 mg/200 mL premix  1,000 mg Intravenous Q12H Leann Trefz Poindexter, PHARMD   1,000 mg at 09/26/11 0910  . DISCONTD: heparin injection 5,000 Units  5,000 Units Subcutaneous Q8H Hind I. Elsaid   5,000 Units at 09/26/11 0654  . DISCONTD: LORazepam (ATIVAN) injection 1 mg  1 mg Intravenous Q6H PRN Hind I. Elsaid   1 mg at 09/26/11 0046   No Known Allergies Active Problems:  Gout  HTN (hypertension)  ETOH abuse  Liver cell injury  Knee arthropathy  Foot deformity   Vital signs in last 24 hours: Temp:  [97.7 F (36.5 C)-97.8 F (36.6 C)] 97.7 F (36.5 C) (11/12 0648) Pulse Rate:  [61-72] 62  (11/12 0648) Resp:  [18] 18  (11/12 0648) BP: (136-153)/(82-98) 149/93 mmHg (11/12 0648) SpO2:  [93 %-98 %] 93 % (11/12 0648) Weight change:  Last BM Date: 09/26/11  Intake/Output from previous day: 11/11 0701 - 11/12 0700 In: 982.5 [P.O.:720; IV Piggyback:162.5] Out: 1600 [Urine:1600] Intake/Output this shift: Total I/O In: 200 [IV Piggyback:200] Out: 200 [Urine:200]  Lab Results:  [facility] 09/25/11 0520 09/23/11 2115  WBC 14.2* 9.8  HGB 11.4* 12.1*  HCT 32.7* 35.4*  PLT 98* 100*   BMET  Basename 09/25/11 0520 09/24/11 0600  NA 135 136  K 3.8 3.9  CL 104 105  CO2 24 27  GLUCOSE 124* 92  BUN 16 16  CREATININE 0.98 0.97  CALCIUM 8.0* 8.1*    Studies/Results: No results found.  Medications: I have reviewed the patient's current medications.   Physical exam GENERAL- alert and well HEAD- normal atraumatic, no neck masses, normal thyroid, no  jvd RESPIRATORY- chest clear, no wheezing, crepitations, rhonchi, normal symmetric air entry CVS- regular rate and rhythm, S1, S2 normal, no murmur, click, rub or gallop ABDOMEN- abdomen is soft without significant tenderness, masses, organomegaly or guarding NEURO- Grossly normal EXTREMITIES- edema left second toe, no bleeding  Plan 1. Osteomyelitis left second toe/gout flare up- Ortho appreciated. Keep on vanc/zosyn for now. Transition to oral postop. Seems doing OK. For surgery ?11/13. NPO after midnight. 2. HTN (hypertension)- Better controlled.  3. ETOH abuse- some anxiety. Increase ativan.  4. 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  5. Knee arthropathy/gout- will eventually need knee replacement. D/c steroids. 6. DVT/GI prophylaxis.      Bryan Reyes 09/26/2011 10:25 AM Pager: [phone].

## 2011-09-26 NOTE — Progress Notes (Signed)
Patient found on floor.  Fall  Not wittnessed.  Patient stated he was reaching for his urinal and slipped in urine.  Patient received 2 mg of IV ativan at 1200.  Bed alarm not in use at time of fall.  Vital signs are 98.7, 57, 19, 158/96, 98% on room air.  Patient says he hit his head and is having some pain all over.  Dr. Venetia Constable notified about fall, vital signs, and what patient said about him hitting his head and his pain.  I asked Dr. Venetia Constable if he wanted to decrease patient's ativan, but he said to leave it as is and to continue to monitor patient.  Attempted to call patient's family by using the only number found in patient's chart, the number has been disconnected.  Patient does not know his family member's number.  Will continue to monitor patient.

## 2011-09-26 NOTE — Progress Notes (Signed)
  60 y/o male with pmh of htn, gout and etoh abuse admitted over the weekend for cellulitis of L foot.  Pt denies any pain.  Says the foot has swelled up and gotten red multiple times over the last couple of yrs.  Pt smokes cigarettes but denies any h/o diabetes or thyroid problems.  PCP is Dr. Orson Aloe at [facility].  PMH:  Htn, gout, etoh abuse.  FH:  htn  SH:  Cigarette use.  Lives alone.  On disability due to gout.  ROS:  No recent f/c/n/v/wt loss.  As above and o/w neg.  PE:  wn wd male in nad.  A and O x 4.  Mood and affect normal.  EOMI.  Resp unlabored.  L foot with dorsal ulcer at PIP joint 2nd toe.  No purulent drainage.  Feels LT.  Palpable DP / PT pulses.  5/5 strength in PF / DF of toes and ankle.  No lymphadenopathy.  Hallux valgus and 2nd hammertoe deformities.  Elveria Royals:  Destruction of bone aroudn PIP joint c/w osteo.  A:  Osteomyelitis and ulcer of L 2nd toe.  P:  I explained in detail to the pt the nature of the bone infection causing the redness and swelling of the left foot.  Options include suppression with abx v. Amputation of the 2nd toe.  Suppression will likely lead to recurrence as there is underlying osteo.  Treatment will likely only occur with amputation.  Pt verbalizes understanding of hte alternative treatment options and would like to proceed with surgery.  To OR tomorrow afternoon for L 2nd toe amputation.

## 2011-09-26 NOTE — Progress Notes (Signed)
ANTIBIOTIC CONSULT NOTE - FOLLOW UP  Pharmacy Consult for Vancomycin Indication: Osteomyelitis of toe  No Known Allergies  Patient Measurements: Height: 6' (182.9 cm) Weight: 238 lb (107.956 kg) IBW/kg (Calculated) : 77.6    Vital Signs: Temp: 97.7 F (36.5 C) (11/12 0648) Temp src: Oral (11/12 0648) BP: 149/93 mmHg (11/12 0648) Pulse Rate: 62  (11/12 0648) Intake/Output from previous day: 11/11 0701 - 11/12 0700 In: 982.5 [P.O.:720; IV Piggyback:162.5] Out: 1600 [Urine:1600] Intake/Output from this shift: Total I/O In: 200 [IV Piggyback:200] Out: 200 [Urine:200]  Labs:  [facility] 09/25/11 0520 09/24/11 0600 09/23/11 2115 09/23/11 1530  WBC 14.2* -- 9.8 9.1  HGB 11.4* -- 12.1* 12.6*  PLT 98* -- 100* 107*  LABCREA -- -- -- --  CREATININE 0.98 0.97 1.00 --   Estimated Creatinine Clearance: 103.1 ml/min (by C-G formula based on Cr of 0.98). No results found for this basename: VANCOTROUGH:2,VANCOPEAK:2,VANCORANDOM:2,GENTTROUGH:2,GENTPEAK:2,GENTRANDOM:2,TOBRATROUGH:2,TOBRAPEAK:2,TOBRARND:2,AMIKACINPEAK:2,AMIKACINTROU:2,AMIKACIN:2, in the last 72 hours   Microbiology: No results found for this or any previous visit (from the past 720 hour(s)).  Medical History: Past Medical History  Diagnosis Date  . Hypertension   . Gout   . Hepatitis C   . Liver disorder     found 2 spots  4 years ago, last report "pretty good"  . Kidney disease     1 spot on kidney, stable at last check 9 months ago  . Knee joint pain 09/23/11    needs replacements on both    Medications:  Scheduled:     . amLODipine  10 mg Oral Daily  . carvedilol  12.5 mg Oral BID WC  . colchicine  0.6 mg Oral BID  . folic acid  1 mg Oral Daily  . heparin  5,000 Units Subcutaneous Q8H  . magnesium oxide  400 mg Oral Daily  . naproxen  500 mg Oral BID WC  . pantoprazole  40 mg Oral Q1200  . piperacillin-tazobactam (ZOSYN)  IV  3.375 g Intravenous Q8H  . predniSONE  10 mg Oral QAC breakfast  . sodium  phosphate  1 enema Rectal Once  . thiamine  100 mg Oral Daily  . vancomycin  1,000 mg Intravenous Q12H   Infusions:   Assessment: 60 year old male with h/o left foot ulcer.  IV Vancomycin and Zosyn to began empirically on 11/9 for treatment of osteomyelitis of the 2nd left toe.  Plan is for toe amputation today.  Goal of Therapy:  Vancomycin trough level 15-20 mcg/ml  Plan:  Continue Vancomycin 1gm IV q12h & Zosyn 2.275gm IV q8h.  Plan to check vancomycin trough tomorrow if continues post-op.  Loralee Pacas R 09/26/2011,9:37 AM

## 2011-09-27 ENCOUNTER — Encounter (HOSPITAL_COMMUNITY): Payer: Self-pay

## 2011-09-27 ENCOUNTER — Other Ambulatory Visit: Payer: Self-pay | Admitting: Orthopedic Surgery

## 2011-09-27 ENCOUNTER — Other Ambulatory Visit: Payer: Self-pay

## 2011-09-27 ENCOUNTER — Inpatient Hospital Stay (HOSPITAL_COMMUNITY): Payer: Non-veteran care | Admitting: Anesthesiology

## 2011-09-27 ENCOUNTER — Encounter (HOSPITAL_COMMUNITY)
Admission: EM | Disposition: A | Discharge status: Home Health | Payer: Self-pay | Source: Home / Self Care | Attending: Internal Medicine

## 2011-09-27 ENCOUNTER — Encounter (HOSPITAL_COMMUNITY): Payer: Self-pay | Admitting: Anesthesiology

## 2011-09-27 DIAGNOSIS — M869 Osteomyelitis, unspecified: Secondary | ICD-10-CM | POA: Diagnosis present

## 2011-09-27 HISTORY — PX: AMPUTATION: SHX166

## 2011-09-27 LAB — BASIC METABOLIC PANEL
CO2: 28 mEq/L (ref 19–32)
Glucose, Bld: 92 mg/dL (ref 70–99)
Potassium: 3.9 mEq/L (ref 3.5–5.1)
Sodium: 140 mEq/L (ref 135–145)

## 2011-09-27 LAB — CBC
Hemoglobin: 11.7 g/dL — ABNORMAL LOW (ref 13.0–17.0)
MCH: 35.2 pg — ABNORMAL HIGH (ref 26.0–34.0)
RBC: 3.32 MIL/uL — ABNORMAL LOW (ref 4.22–5.81)

## 2011-09-27 SURGERY — AMPUTATION DIGIT
Anesthesia: General | Site: Toe | Laterality: Left | Wound class: Clean Contaminated

## 2011-09-27 MED ORDER — PROPOFOL 10 MG/ML IV EMUL
INTRAVENOUS | Status: DC | PRN
  Administered 2011-09-27: 200 mg via INTRAVENOUS

## 2011-09-27 MED ORDER — LIDOCAINE HCL (CARDIAC) 20 MG/ML IV SOLN
INTRAVENOUS | Status: DC | PRN
  Administered 2011-09-27: 80 mg via INTRAVENOUS

## 2011-09-27 MED ORDER — AMOXICILLIN-POT CLAVULANATE 875-125 MG PO TABS
1.0000 | ORAL_TABLET | Freq: Two times a day (BID) | ORAL | Status: DC
  Administered 2011-09-27 – 2011-09-28 (×2): 1 via ORAL
  Filled 2011-09-27 (×5): qty 1

## 2011-09-27 MED ORDER — HYDROMORPHONE HCL PF 1 MG/ML IJ SOLN: 0.2500 mg | INTRAMUSCULAR | Status: DC | PRN

## 2011-09-27 MED ORDER — EPHEDRINE SULFATE 50 MG/ML IJ SOLN
INTRAMUSCULAR | Status: DC | PRN
  Administered 2011-09-27: 10 mg via INTRAVENOUS

## 2011-09-27 MED ORDER — LACTATED RINGERS IV SOLN
INTRAVENOUS | Status: DC
  Administered 2011-09-27: 1000 mL via INTRAVENOUS

## 2011-09-27 MED ORDER — THERA M PLUS PO TABS
1.0000 | ORAL_TABLET | Freq: Every day | ORAL | Status: DC
  Administered 2011-09-27 – 2011-09-28 (×2): 1 via ORAL
  Filled 2011-09-27 (×3): qty 1

## 2011-09-27 MED ORDER — LACTATED RINGERS IV SOLN
INTRAVENOUS | Status: DC | PRN
  Administered 2011-09-27 (×2): via INTRAVENOUS

## 2011-09-27 MED ORDER — MIDAZOLAM HCL 5 MG/5ML IJ SOLN
INTRAMUSCULAR | Status: DC | PRN
  Administered 2011-09-27: 2 mg via INTRAVENOUS

## 2011-09-27 MED ORDER — PIPERACILLIN-TAZOBACTAM 3.375 G IVPB 30 MIN
INTRAVENOUS | Status: DC | PRN
  Administered 2011-09-27: 3.375 g via INTRAVENOUS

## 2011-09-27 MED ORDER — PROMETHAZINE HCL 25 MG/ML IJ SOLN: 6.2500 mg | INTRAMUSCULAR | Status: DC | PRN

## 2011-09-27 MED ORDER — GLYCOPYRROLATE 0.2 MG/ML IJ SOLN
INTRAMUSCULAR | Status: DC | PRN
  Administered 2011-09-27: .2 mg via INTRAVENOUS

## 2011-09-27 MED ORDER — FENTANYL CITRATE 0.05 MG/ML IJ SOLN
INTRAMUSCULAR | Status: DC | PRN
  Administered 2011-09-27 (×2): 50 ug via INTRAVENOUS
  Administered 2011-09-27: 100 ug via INTRAVENOUS
  Administered 2011-09-27: 50 ug via INTRAVENOUS

## 2011-09-27 MED ORDER — ONDANSETRON HCL 4 MG/2ML IJ SOLN
INTRAMUSCULAR | Status: DC | PRN
  Administered 2011-09-27 (×2): 2 mg via INTRAVENOUS

## 2011-09-27 MED ORDER — HYDROMORPHONE HCL PF 1 MG/ML IJ SOLN: 0.5000 mg | INTRAMUSCULAR | Status: DC | PRN

## 2011-09-27 SURGICAL SUPPLY — 29 items
BNDG CMPR 9X4 STRL LF SNTH (GAUZE/BANDAGES/DRESSINGS) ×1
BNDG COHESIVE 6X5 TAN STRL LF (GAUZE/BANDAGES/DRESSINGS) ×2 IMPLANT
BNDG ESMARK 4X9 LF (GAUZE/BANDAGES/DRESSINGS) ×2 IMPLANT
CHLORAPREP W/TINT 26ML (MISCELLANEOUS) ×2 IMPLANT
CLOTH BEACON ORANGE TIMEOUT ST (SAFETY) ×2 IMPLANT
CONT SPECI 4OZ STER CLIK (MISCELLANEOUS) ×1 IMPLANT
CUFF TOURN SGL QUICK 34 (TOURNIQUET CUFF)
CUFF TRNQT CYL 34X4X40X1 (TOURNIQUET CUFF) ×1 IMPLANT
DRAPE U-SHAPE 47X51 STRL (DRAPES) ×2 IMPLANT
DRSG ADAPTIC 3X8 NADH LF (GAUZE/BANDAGES/DRESSINGS) ×2 IMPLANT
ELECT REM PT RETURN 9FT ADLT (ELECTROSURGICAL) ×2
ELECTRODE REM PT RTRN 9FT ADLT (ELECTROSURGICAL) ×1 IMPLANT
GLOVE BIO SURGEON STRL SZ8 (GLOVE) ×4 IMPLANT
GLOVE BIOGEL PI IND STRL 8 (GLOVE) ×1 IMPLANT
GLOVE BIOGEL PI INDICATOR 8 (GLOVE) ×1
GOWN PREVENTION PLUS XLARGE (GOWN DISPOSABLE) ×2 IMPLANT
GOWN STRL NON-REIN LRG LVL3 (GOWN DISPOSABLE) ×2 IMPLANT
KIT BASIN OR (CUSTOM PROCEDURE TRAY) ×2 IMPLANT
MANIFOLD NEPTUNE II (INSTRUMENTS) ×2 IMPLANT
PACK LOWER EXTREMITY WL (CUSTOM PROCEDURE TRAY) ×2 IMPLANT
PAD CAST 4YDX4 CTTN HI CHSV (CAST SUPPLIES) ×1 IMPLANT
PADDING CAST COTTON 4X4 STRL (CAST SUPPLIES) ×2
PADDING WEBRIL 4 STERILE (GAUZE/BANDAGES/DRESSINGS) ×1 IMPLANT
SPONGE GAUZE 4X4 12PLY (GAUZE/BANDAGES/DRESSINGS) ×2 IMPLANT
SUCTION FRAZIER TIP 10 FR DISP (SUCTIONS) ×2 IMPLANT
SUT ETHILON 2 0 PS N (SUTURE) ×2 IMPLANT
SUT ETHILON 3 0 PS 1 (SUTURE) ×2 IMPLANT
SUT PDS AB 2-0 CT2 27 (SUTURE) ×1 IMPLANT
TOWEL OR 17X26 10 PK STRL BLUE (TOWEL DISPOSABLE) ×2 IMPLANT

## 2011-09-27 NOTE — Anesthesia Preprocedure Evaluation (Signed)
Anesthesia Evaluation  Patient identified by MRN, date of birth, ID band Patient awake  General Assessment Comment:Infection precautions  Reviewed: Allergy & Precautions, H&P , NPO status , Patient's Chart, lab work & pertinent test results, reviewed documented beta blocker date and time   Airway Mallampati: II TM Distance: >3 FB Neck ROM: Full    Dental  (+) Poor Dentition and Dental Advisory Given   Pulmonary neg pulmonary ROS, Current Smoker,    + decreased breath sounds      Cardiovascular hypertension, Pt. on medications Regular Normal Denies cardiac symptoms   Neuro/Psych ETOH abuseNegative Neurological ROS     GI/Hepatic negative GI ROS, (+) Hepatitis -, C  Endo/Other  Gout  Renal/GU negative Renal ROS  Genitourinary negative   Musculoskeletal negative musculoskeletal ROS (+)   Abdominal   Peds negative pediatric ROS (+)  Hematology negative hematology ROS (+)   Anesthesia Other Findings   Reproductive/Obstetrics negative OB ROS                           Anesthesia Physical Anesthesia Plan  ASA: III  Anesthesia Plan: General   Post-op Pain Management:    Induction: Intravenous  Airway Management Planned: LMA  Additional Equipment:   Intra-op Plan:   Post-operative Plan: Extubation in OR  Informed Consent: I have reviewed the patients History and Physical, chart, labs and discussed the procedure including the risks, benefits and alternatives for the proposed anesthesia with the patient or authorized representative who has indicated his/her understanding and acceptance.   Dental advisory given  Plan Discussed with: CRNA and Surgeon  Anesthesia Plan Comments:         Anesthesia Quick Evaluation

## 2011-09-27 NOTE — Preoperative (Signed)
Beta Blockers   Reason not to administer Beta Blockers:Hold beta blocker due to other 

## 2011-09-27 NOTE — Anesthesia Postprocedure Evaluation (Signed)
  Anesthesia Post-op Note  Patient: Bryan Reyes  Procedure(s) Performed:  AMPUTATION DIGIT - Left second toe amputation  Patient Location: PACU  Anesthesia Type: General  Level of Consciousness: alert  and oriented  Airway and Oxygen Therapy: Patient Spontanous Breathing and Patient connected to nasal cannula oxygen  Post-op Pain: mild  Post-op Assessment: Post-op Vital signs reviewed, Patient's Cardiovascular Status Stable, Respiratory Function Stable and Patent Airway  Post-op Vital Signs: stable  Complications: No apparent anesthesia complications

## 2011-09-27 NOTE — Op Note (Signed)
NAMEDIYAN, Bryan Reyes                ACCOUNT NO.:  [id number]  MEDICAL RECORD NO.:  [id number]  LOCATION:  1427                         FACILITY:  [facility]  PHYSICIAN:  Toni Arthurs, MD        DATE OF BIRTH:  1951/01/06  DATE OF PROCEDURE:  09/27/2011 DATE OF DISCHARGE:                              OPERATIVE REPORT   PREOPERATIVE DIAGNOSIS:  Left second toe osteomyelitis.  POSTOP DIAGNOSIS:  Left second toe osteomyelitis.  PROCEDURE:  Amputation of left second toe.  SURGEON:  Toni Arthurs, MD.  ANESTHESIA:  General.  ESTIMATED BLOOD LOSS:  Minimal.  TOURNIQUET TIME:  5 minutes with an ankle Esmarch.  COMPLICATIONS:  None apparent.  SPECIMEN:  Left second toe to pathology, deep tissue left second toe PIP joint, microbiology.  DISPOSITION:  Extubated, awake, and stable to recovery.  INDICATIONS FOR PROCEDURE:  The patient is a 60 year old male with past medical history significant for alcohol abuse, and hypertension.  He presented to the hospital on September 23, 2011, with cellulitis of his left foot.  An x-ray was obtained revealing destruction of the left second toe PIP joint at the site of an ulcer.  The patient has a history of a hammertoe there for many years.  He reports that it has ben periodically swelling and getting red and warm and draining over the last 2 years.  This is the worst that it has been now.  X-rays reveal osteomyelitis of the left second toe at the proximal and middle phalanges.  He presents now for amputation of this toe.  He understands the risks and benefits of the procedure, as well as the alternative treatment options and would like to proceed.  Specifically, he understands risks of bleeding, infection, nerve damage, blood clots, need for additional surgery, amputation, and death.  PROCEDURE IN DETAIL:  After preoperative consent was obtained, the correct operative site was identified.  The patient brought to the operating room and placed supine on  the operating table.  General anesthesia was induced.  Preoperative antibiotics, so the patient was already on therapeutic antibiotics.  Surgical time-out was taken.  The left lower extremity was prepped and draped in standard sterile fashion. A racket style incision was marked on the skin at the base of the second toe.  The extremity was exsanguinated and a 4-inch Esmarch tourniquet was wrapped around the ankle.  The previously marked incision was made. Sharp dissection was carried down to the level of the bone.  The toe was disarticulated at the MP joint.  A bone cutter was used to resect the metatarsal head.  The toe was passed off the field and deep tissue was obtained from the area of the second toe PIP joint.  This deep tissue was sent as a specimen to microbiology and the toe was sent as specimen to pathology.  The wound was irrigated copiously.  The tourniquet was released.  Hemostasis was achieved.  2-0 PDS simple sutures were used to approximate the deep soft tissues and the subcutaneous tissues.  The skin was closed with horizontal mattress sutures of 2-0 nylon.  Sterile dressings were applied followed by a compression wrap.  The patient was  then awakened by anesthesia and transported to recovery room in stable condition.  FOLLOWUP PLAN:  The patient will be weightbearing as tolerated on his left foot and a hard sole shoe.  He will follow up with me in 2 weeks for suture removal.  He will be readmitted to the [facility].     Toni Arthurs, MD     JH/MEDQ  D:  09/27/2011  T:  09/27/2011  Job:  [id number]

## 2011-09-27 NOTE — Interval H&P Note (Signed)
History and Physical Interval Note:   09/27/2011   3:58 PM   Bryan Reyes  has presented today for surgery, with the diagnosis of left 2nd toe osteomyelitis.  The various methods of treatment have been discussed with the patient and family. After consideration of risks, benefits and other options for treatment, the patient has consented to  Procedure(s):  Left 2nd toe amputation as a surgical intervention .  The patients' history has been reviewed, patient examined, no change in status, stable for surgery.  I have reviewed the patients' chart and labs.  Questions were answered to the patient's satisfaction.  He understands the risks and benefits of the alternative treatment options and elects to proceed.   Toni Arthurs  MD

## 2011-09-27 NOTE — Transfer of Care (Signed)
Immediate Anesthesia Transfer of Care Note  Patient: Bryan Reyes  Procedure(s) Performed:  AMPUTATION DIGIT - Left second toe amputation  Patient Location: PACU  Anesthesia Type: General  Level of Consciousness: awake, alert  and patient cooperative  Airway & Oxygen Therapy: Patient Spontanous Breathing and Patient connected to face mask oxygen  Post-op Assessment: Report given to PACU RN, Post -op Vital signs reviewed and stable and Patient moving all extremities X 4  Post vital signs: Reviewed and stable  Complications: No apparent anesthesia complications

## 2011-09-27 NOTE — Brief Op Note (Signed)
09/23/2011 - 09/27/2011  5:06 PM  PATIENT:  Bryan Reyes  59 y.o. male  PRE-OPERATIVE DIAGNOSIS:  osteomylitis left 2nd toe   POST-OPERATIVE DIAGNOSIS:  osteomylitis left 2nd toe   PROCEDURE:  Amputation left 2nd toe   SURGEON:  Toni Arthurs, MD  ASSISTANT: n/a  ANESTHESIA:   General   EBL:  Minimal  TOURNIQUET:  5 min with ankle esmarch   Specimen:  L 2nd toe to path.  Deep tissue L 2nd toe PIP joint to micro.   COMPLICATIONS:  None apparent  DISPOSITION:  Extubated, awake and stable to recovery.  DICTATION ID:  [id number]

## 2011-09-27 NOTE — Progress Notes (Signed)
NOTED S/P AMPUTATION TOE TODAY.IF ANY HOME CARE  NEEDS RECOMMENDED CAN ARRANGE.RECOMMEND PT/OT CONS WHEN MD FEEL MEDICALLY APPROPRIATE.ALSO CONTACED [employer] W/UPDATE(APRIL-TEL#[phone] [id number])

## 2011-09-27 NOTE — H&P (View-Only) (Signed)
  60 y/o male with pmh of htn, gout and etoh abuse admitted over the weekend for cellulitis of L foot.  Pt denies any pain.  Says the foot has swelled up and gotten red multiple times over the last couple of yrs.  Pt smokes cigarettes but denies any h/o diabetes or thyroid problems.  PCP is Dr. Henderson at [facility].  PMH:  Htn, gout, etoh abuse.  FH:  htn  SH:  Cigarette use.  Lives alone.  On disability due to gout.  ROS:  No recent f/c/n/v/wt loss.  As above and o/w neg.  PE:  wn wd male in nad.  A and O x 4.  Mood and affect normal.  EOMI.  Resp unlabored.  L foot with dorsal ulcer at PIP joint 2nd toe.  No purulent drainage.  Feels LT.  Palpable DP / PT pulses.  5/5 strength in PF / DF of toes and ankle.  No lymphadenopathy.  Hallux valgus and 2nd hammertoe deformities.  Xrya:  Destruction of bone aroudn PIP joint c/w osteo.  A:  Osteomyelitis and ulcer of L 2nd toe.  P:  I explained in detail to the pt the nature of the bone infection causing the redness and swelling of the left foot.  Options include suppression with abx v. Amputation of the 2nd toe.  Suppression will likely lead to recurrence as there is underlying osteo.  Treatment will likely only occur with amputation.  Pt verbalizes understanding of hte alternative treatment options and would like to proceed with surgery.  To OR tomorrow afternoon for L 2nd toe amputation. 

## 2011-09-27 NOTE — Progress Notes (Signed)
Bryan Reyes is a 60 y.o. male patient admitted on 09/23/11 with complaints of left foot swelling. He was found to have recurrent osteomyelitis of the left second toe, with concern for a gout attack as well. Patient had been followed at the [place] where there had been consideration of amputation of the toe. He was seen by Dr Shon Baton who recommended amputation of the toe, done by Dr Victorino Dike today. The gout fare up has since resolved after a short course of steroids. i have switched patient to Augmentin today. Would continue it to complete at least 2 weeks of antibiotics.  SUBJECTIVE Feels great, hungry.   1. Osteomyelitis of toe   2. Pain     Past Medical History  Diagnosis Date  . Hypertension   . Gout   . Hepatitis C   . Liver disorder     found 2 spots  4 years ago, last report "pretty good"  . Kidney disease     1 spot on kidney, stable at last check 9 months ago  . Knee joint pain 09/23/11    needs replacements on both   Current Facility-Administered Medications  Medication Dose Route Frequency Provider Last Rate Last Dose  . amLODipine (NORVASC) tablet 10 mg  10 mg Oral Daily Hind I. Elsaid   10 mg at 09/27/11 0957  . amoxicillin-clavulanate (AUGMENTIN) 875-125 MG per tablet 1 tablet  1 tablet Oral Q12H Finneus Kaneshiro      . carvedilol (COREG) tablet 12.5 mg  12.5 mg Oral BID WC Glenwood Revoir   12.5 mg at 09/27/11 1825  . chlorhexidine (HIBICLENS) 4 % liquid 4 application  60 mL Topical Once Toni Arthurs, MD      . chlorhexidine (HIBICLENS) 4 % liquid   Topical Once Yakir Wenke      . colchicine tablet 0.6 mg  0.6 mg Oral BID Hind I. Elsaid   0.6 mg at 09/27/11 0957  . folic acid (FOLVITE) tablet 1 mg  1 mg Oral Daily Hind I. Elsaid   1 mg at 09/27/11 0957  . HYDROmorphone (DILAUDID) injection 0.25-0.5 mg  0.25-0.5 mg Intravenous Q5 min PRN Jill Side, MD      . HYDROmorphone (DILAUDID) injection 0.5 mg  0.5 mg Intravenous Q15 min PRN Jill Side, MD      . lactated  ringers infusion   Intravenous Continuous Jill Side, MD 100 mL/hr at 09/27/11 1521 1,000 mL at 09/27/11 1521  . LORazepam (ATIVAN) tablet 0.5 mg  0.5 mg Oral Q6H PRN Jasani Dolney   0.5 mg at 09/27/11 1125  . magnesium oxide (MAG-OX) tablet 400 mg  400 mg Oral Daily Trynity Skousen   400 mg at 09/27/11 0957  . morphine 2 MG/ML injection 1 mg  1 mg Intravenous Q4H PRN Mary A. Lynch   1 mg at 09/27/11 1332  . multivitamins ther. w/minerals tablet 1 tablet  1 tablet Oral Daily Toni Arthurs, MD   1 tablet at 09/27/11 1825  . naproxen (NAPROSYN) tablet 500 mg  500 mg Oral BID WC Hind I. Elsaid   500 mg at 09/27/11 1826  . pantoprazole (PROTONIX) EC tablet 40 mg  40 mg Oral Q1200 Evann Koelzer   40 mg at 09/27/11 1125  . piperacillin-tazobactam (ZOSYN) IVPB 3.375 g  3.375 g Intravenous Q8H Hind I. Elsaid   3.375 g at 09/27/11 1400  . promethazine (PHENERGAN) injection 6.25-12.5 mg  6.25-12.5 mg Intravenous Q15 min PRN Jill Side, MD      .  sodium chloride 0.9 % injection 3 mL  3 mL Intravenous PRN Hind I. Elsaid   3 mL at 09/23/11 2114  . thiamine (VITAMIN B-1) tablet 100 mg  100 mg Oral Daily Hind I. Elsaid   100 mg at 09/27/11 0957  . DISCONTD: 0.9 %  sodium chloride infusion   Intravenous Continuous Toni Arthurs, MD 20 mL/hr at 09/26/11 2014 250 mL at 09/26/11 2014  . DISCONTD: LORazepam (ATIVAN) 2 MG/ML concentrated solution 0.5 mg  0.5 mg Oral Q6H PRN Peggy Monk      . DISCONTD: LORazepam (ATIVAN) injection 2 mg  2 mg Intravenous Q4H PRN Kallista Pae   2 mg at 09/26/11 1157  . DISCONTD: sodium phosphate (FLEET) 7-19 GM/118ML enema 1 enema  1 enema Rectal Once Dahari D Brooks      . DISCONTD: vancomycin (VANCOCIN) IVPB 1000 mg/200 mL premix  1,000 mg Intravenous Q12H Leann Trefz Poindexter, PHARMD   1,000 mg at 09/27/11 1610   Facility-Administered Medications Ordered in Other Encounters  Medication Dose Route Frequency Provider Last Rate Last Dose  . DISCONTD: ePHEDrine injection     PRN Alfredo Bach   10 mg at 09/27/11 1630  . DISCONTD: fentaNYL (SUBLIMAZE) injection    PRN Alfredo Bach   50 mcg at 09/27/11 1649  . DISCONTD: glycopyrrolate (ROBINUL) injection    PRN Maxine Glenn Gray   0.2 mg at 09/27/11 1645  . DISCONTD: lactated ringers infusion    Continuous PRN Alfredo Bach      . DISCONTD: lidocaine (cardiac) 100 mg/59ml (XYLOCAINE) 20 MG/ML injection 2%    PRN Maxine Glenn Gray   80 mg at 09/27/11 1615  . DISCONTD: midazolam (VERSED) 5 MG/5ML injection    PRN Alfredo Bach   2 mg at 09/27/11 1545  . DISCONTD: ondansetron (ZOFRAN) injection    PRN Alfredo Bach   2 mg at 09/27/11 1615  . DISCONTD: piperacillin-tazobactam (ZOSYN) IVPB    PRN Alfredo Bach   3.375 g at 09/27/11 1620  . DISCONTD: propofol (DIPRIVAN) 10 MG/ML infusion    PRN Alfredo Bach   200 mg at 09/27/11 1615   No Known Allergies Active Problems:  Gout  HTN (hypertension)  ETOH abuse  Liver cell injury  Knee arthropathy  Foot deformity  Osteomyelitis of toe of left foot   Vital signs in last 24 hours: Temp:  [96.7 F (35.9 C)-98.5 F (36.9 C)] 97.6 F (36.4 C) (11/13 1750) Pulse Rate:  [51-68] 57  (11/13 1825) Resp:  [14-20] 18  (11/13 1750) BP: (133-156)/(76-90) 156/90 mmHg (11/13 1825) SpO2:  [95 %-99 %] 99 % (11/13 1750) Weight change:  Last BM Date: 09/26/11  Intake/Output from previous day: 11/12 0701 - 11/13 0700 In: 1660 [P.O.:721; I.V.:390; IV Piggyback:549] Out: 3400 [Urine:3400] Intake/Output this shift: Total I/O In: 1730 [I.V.:1530; IV Piggyback:200] Out: 1200 [Urine:1200]  Lab Results:  [facility] 09/27/11 0441 09/25/11 0520  WBC 9.6 14.2*  HGB 11.7* 11.4*  HCT 34.0* 32.7*  PLT 81* 98*   BMET  Basename 09/27/11 0441 09/25/11 0520  NA 140 135  K 3.9 3.8  CL 107 104  CO2 28 24  GLUCOSE 92 124*  BUN 15 16  CREATININE 1.14 0.98  CALCIUM 8.4 8.0*    Studies/Results: No results found.  Medications: I have  reviewed the patient's current medications.   Physical exam GENERAL- alert, robust and well HEAD- normal atraumatic, no neck masses, normal thyroid, no jvd RESPIRATORY- chest  clear, no wheezing, crepitations, rhonchi, normal symmetric air entry CVS- regular rate and rhythm, S1, S2 normal, no murmur, click, rub or gallop ABDOMEN- abdomen is soft without significant tenderness, masses, organomegaly or guarding NEURO- Grossly normal EXTREMITIES- edema no bleeding from left foot.  Plan 1. Osteomyelitis left second toe/gout flare up- Ortho appreciated. S/p amputation. Follow ortho recommendations. 2. HTN (hypertension)- Better controlled.  3. ETOH abuse- no sign of withdrawal.  4. 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  5. Knee arthropathy/gout- no evidence of flare up. 6. DVT/GI prophylaxis. 7. Disposition- hopefully home with PT once OK with Ortho.     Mark Hassey 09/27/2011 6:35 PM Pager: [phone].

## 2011-09-28 LAB — CBC
MCH: 34.7 pg — ABNORMAL HIGH (ref 26.0–34.0)
MCHC: 33.7 g/dL (ref 30.0–36.0)
MCV: 102.7 fL — ABNORMAL HIGH (ref 78.0–100.0)
Platelets: 86 10*3/uL — ABNORMAL LOW (ref 150–400)
RDW: 14.9 % (ref 11.5–15.5)

## 2011-09-28 LAB — GRAM STAIN

## 2011-09-28 LAB — BASIC METABOLIC PANEL
CO2: 30 mEq/L (ref 19–32)
Calcium: 8.2 mg/dL — ABNORMAL LOW (ref 8.4–10.5)
Creatinine, Ser: 1.01 mg/dL (ref 0.50–1.35)
GFR calc Af Amer: >90 mL/min (ref >90)
GFR calc non Af Amer: 79 mL/min — ABNORMAL LOW (ref >90)

## 2011-09-28 LAB — MAGNESIUM: Magnesium: 1.7 mg/dL (ref 1.5–2.5)

## 2011-09-28 MED ORDER — AMOXICILLIN-POT CLAVULANATE 875-125 MG PO TABS: 1.0000 | ORAL_TABLET | Freq: Two times a day (BID) | ORAL | Status: AC

## 2011-09-28 MED ORDER — HYDROCODONE-ACETAMINOPHEN 7.5-325 MG PO TABS: 1.0000 | ORAL_TABLET | Freq: Four times a day (QID) | ORAL | Status: AC | PRN

## 2011-09-28 NOTE — Discharge Summary (Signed)
Physician Discharge Summary  ELESTER APODACA MRN: [id number] DOB/AGE: February 10, 1951 60 y.o.  PCP: No primary provider on file.   Admit date: 09/23/2011 Discharge date: 09/28/2011  Discharge Diagnoses:   Amputation of the left second toe  Gout  HTN (hypertension)  ETOH abuse  Liver cell injury  Knee arthropathy  Foot deformity  Osteomyelitis of toe of left foot   Current Discharge Medication List    START taking these medications   Details        !! amoxicillin-clavulanate (AUGMENTIN) 875-125 MG per tablet Take 1 tablet by mouth 2 (two) times daily. Qty: 30 tablet, Refills: 0    HYDROcodone-acetaminophen (NORCO) 7.5-325 MG per tablet Take 1 tablet by mouth every 6 (six) hours as needed for pain. Qty: 30 tablet, Refills: 0     !! - Potential duplicate medications found. Please discuss with provider.    CONTINUE these medications which have NOT CHANGED   Details  amLODipine (NORVASC) 10 MG tablet Take 10 mg by mouth daily.      carvedilol (COREG) 6.25 MG tablet Take 6.25 mg by mouth 2 (two) times daily with a meal.      furosemide (LASIX) 20 MG tablet Take 20 mg by mouth daily.      ketoconazole (NIZORAL) 2 % cream Apply 1 application topically 2 (two) times daily as needed. Apply to itchy areas on legs/arms     miconazole (MICOTIN) 2 % powder Apply 1 application topically 2 (two) times daily as needed. Apply to feet between toes but not to ulcer.     Multiple Vitamins-Minerals (MULTIVITAMINS THER. W/MINERALS) TABS Take 1 tablet by mouth daily.      naproxen (NAPROSYN) 500 MG tablet Take 500 mg by mouth 2 (two) times daily with a meal.      omeprazole (PRILOSEC) 20 MG capsule Take 20 mg by mouth daily.      predniSONE (STERAPRED UNI-PAK) 10 MG tablet Take 10 mg by mouth daily.      spironolactone (ALDACTONE) 25 MG tablet Take 50 mg by mouth daily.          Discharge Condition: Stable  Disposition: Being discharged with home health physical therapy and home  health nursing for dressing changes   Consults: Dr. Toni Arthurs   Significant Diagnostic Studies: Dg Chest Portable 1 View  09/23/2011  *RADIOLOGY REPORT*  Clinical Data: Shortness of breath  PORTABLE CHEST - 1 VIEW  Comparison: None.  Findings: Lungs are clear. No pleural effusion or pneumothorax.  The heart is top normal in size.  IMPRESSION: No evidence of acute cardiopulmonary disease.  Original Report Authenticated By: Charline Bills, M.D.   Dg Toe 2nd Left  09/23/2011  *RADIOLOGY REPORT*  Clinical Data: Borderline diabetic.  Infected toe.  Question osteomyelitis?  LEFT TOE - 2+ VIEW  Comparison: None.  Findings: Destruction of the left second proximal interphalangeal joint space consistent with osteomyelitis.  IMPRESSION: Destruction of the left second proximal interphalangeal joint space consistent with osteomyelitis.  Original Report Authenticated By: Fuller Canada, M.D.     Microbiology: Recent Results (from the past 240 hour(s))  SURGICAL PCR SCREEN     Status: Normal   Collection Time   09/26/11 12:15 PM      Component Value Range Status Comment   MRSA, PCR NEGATIVE  NEGATIVE  Final    Staphylococcus aureus NEGATIVE  NEGATIVE  Final   GRAM STAIN     Status: Normal   Collection Time   09/27/11  4:41 PM      Component Value Range Status Comment   Specimen Description TOE LEFT SECOND TOE TISSUE   Final    Special Requests NONE   Final    Gram Stain     Final    Value: FEW WBC PRESENT, PREDOMINANTLY PMN     NO ORGANISMS SEEN   Report Status 09/28/2011 FINAL   Final      Labs: Results for orders placed during the hospital encounter of 09/23/11 (from the past 48 hour(s))  CBC     Status: Abnormal   Collection Time   09/27/11  4:41 AM      Component Value Range Comment   WBC 9.6  4.0 - 10.5 (K/uL)    RBC 3.32 (*) 4.22 - 5.81 (MIL/uL)    Hemoglobin 11.7 (*) 13.0 - 17.0 (g/dL)    HCT 16.1 (*) 09.6 - 52.0 (%)    MCV 102.4 (*) 78.0 - 100.0 (fL)    MCH 35.2 (*) 26.0 -  34.0 (pg)    MCHC 34.4  30.0 - 36.0 (g/dL)    RDW 04.5  40.9 - 81.1 (%)    Platelets 81 (*) 150 - 400 (K/uL)   BASIC METABOLIC PANEL     Status: Abnormal   Collection Time   09/27/11  4:41 AM      Component Value Range Comment   Sodium 140  135 - 145 (mEq/L)    Potassium 3.9  3.5 - 5.1 (mEq/L)    Chloride 107  96 - 112 (mEq/L)    CO2 28  19 - 32 (mEq/L)    Glucose, Bld 92  70 - 99 (mg/dL)    BUN 15  6 - 23 (mg/dL)    Creatinine, Ser 9.14  0.50 - 1.35 (mg/dL)    Calcium 8.4  8.4 - 10.5 (mg/dL)    GFR calc non Af Amer 69 (*) >90 (mL/min)    GFR calc Af Amer 80 (*) >90 (mL/min)   GRAM STAIN     Status: Normal   Collection Time   09/27/11  4:41 PM      Component Value Range Comment   Specimen Description TOE LEFT SECOND TOE TISSUE      Special Requests NONE      Gram Stain        Value: FEW WBC PRESENT, PREDOMINANTLY PMN     NO ORGANISMS SEEN   Report Status 09/28/2011 FINAL     CBC     Status: Abnormal   Collection Time   09/28/11  5:15 AM      Component Value Range Comment   WBC 7.8  4.0 - 10.5 (K/uL)    RBC 3.29 (*) 4.22 - 5.81 (MIL/uL)    Hemoglobin 11.4 (*) 13.0 - 17.0 (g/dL)    HCT 78.2 (*) 95.6 - 52.0 (%)    MCV 102.7 (*) 78.0 - 100.0 (fL)    MCH 34.7 (*) 26.0 - 34.0 (pg)    MCHC 33.7  30.0 - 36.0 (g/dL)    RDW 21.3  08.6 - 57.8 (%)    Platelets 86 (*) 150 - 400 (K/uL)   BASIC METABOLIC PANEL     Status: Abnormal   Collection Time   09/28/11  5:15 AM      Component Value Range Comment   Sodium 136  135 - 145 (mEq/L)    Potassium 3.9  3.5 - 5.1 (mEq/L)    Chloride 102  96 - 112 (mEq/L)  CO2 30  19 - 32 (mEq/L)    Glucose, Bld 95  70 - 99 (mg/dL)    BUN 16  6 - 23 (mg/dL)    Creatinine, Ser 1.61  0.50 - 1.35 (mg/dL)    Calcium 8.2 (*) 8.4 - 10.5 (mg/dL)    GFR calc non Af Amer 79 (*) >90 (mL/min)    GFR calc Af Amer >90  >90 (mL/min)   MAGNESIUM     Status: Normal   Collection Time   09/28/11  5:15 AM      Component Value Range Comment   Magnesium 1.7   1.5 - 2.5 (mg/dL)      HPI :60 year old male with varus deformity of the food and gouty arthritis , he has history of left foot ulcer previously about 2 years ago treated with iv antibiotics ,and it well healed ,apparently there was plan to amputate that toe but it healed by itself. Patient admitted  he has more frequent blistering , treated with po Antibiotics, last treatment 3 weeks ago , patient has history of severe gout. For the last 2 weeks  Patient relates having swelling in his left foot ,he states last week his second toe started swelling and split open. He relates now he has swelling and pain of his whole foot.Bryan Reyes He denies fever or chills, any states he has a lot of pain when he tries to walk on it. He relates they almost had to amputate his toe however they were able to save it. He normally walks with a cane because he needs bilateral knee replacement and he still using a cane to walk.An x-ray was obtained revealing destruction of the left  second toe PIP joint at the site of an ulcer. The patient has a history  of a hammertoe there for many years. He reports that it has ben  periodically swelling and getting red and warm and draining over the  last 2 years. This is the worst that it has been now. X-rays reveal  osteomyelitis of the left second toe at the proximal and middle  phalanges.    HOSPITAL COURSE:   Present on Admission:  1-osteomyelitis/cellulitis of the second left toe: He was started  on vancomycin and zosyn, because of his history of gout he was also treated with naprosyn and prednisone , start orthopedics Dr. Toni Arthurs was consulted. He recommended amputation. Postoperative course was uncomplicated and the patient was extubated right away. She was evaluated by physical therapy and he demonstrated difficulty walking secondary to pain. Recommendations were physical therapy is for home health PT which will be arranged for by our case manager prior to discharge. The patient  will continue on Augmentin for another 10 days. Pain medications will be added.Activity as tolerated. Bear weight as tolerated in hard sole shoe. Wound cultures are still pending at this time.  .Gout: has no other gouty tophi, but he has deformity of his foot that could aggravates ulceration and osteomyelitis , was treated with , with prednisone and colchicine .HTN (hypertension): resume home medications .ETOH abuse: ativan iv added as PRN and thiamine and folic .Liver cell injury:patient admitted he has benign spot on his liver and this being followed by his MD from [place] , this can be followed as out patient. His transaminitis was consistent with alcoholic liver disease.  .Knee arthropathy: no evidence of effusion at this time. Bryan Reyes       Discharge Exam:  Blood pressure 143/86, pulse 54, temperature 98.1 F (  36.7 C), temperature source Oral, resp. rate 16, height 6' (1.829 m), weight 107.956 kg (238 lb), SpO2 95.00%.   General: Alert, awake, oriented x3, in no acute distress. HEENT: No bruits, no goiter. Heart: Regular rate and rhythm, without murmurs, rubs, gallops. Lungs: Clear to auscultation bilaterally. Abdomen: Soft, nontender, nondistended, positive bowel sounds. Extremities: No clubbing cyanosis or edema with positive pedal pulses. Neuro: Grossly intact, nonfocal.    Discharge Orders    Future Orders Please Complete By Expires   Diet - low sodium heart healthy      Home Health      Questions: Responses:   To provide the following care/treatments RN    PT   Face-to-face encounter      Comments:   I Richarda Overlie certify that this patient is under my care and that I, or a nurse practitioner or physician's assistant working with me, had a face-to-face encounter that meets the physician face-to-face encounter requirements with this patient on 09/28/2011.       Questions: Responses:   The encounter with the patient was in whole, or in part, for the following medical condition,  which is the primary reason for home health care toe amputation   I certify that, based on my findings, the following services are medically necessary home health services Physical therapy   My clinical findings support the need for the above services High Risk for rehospitalization   Further, I certify that my clinical findings support that this patient is homebound (i.e. absences from home require considerable and taxing effort and are for medical reasons or religious services or infrequently or of short duration when for other reasons) Pain interferes with ambulation/mobility   To provide the following care/treatments RN    PT   Increase activity slowly      Change dressing (specify)      Comments:   Once daily*.   Call MD for:  temperature >100.4      Call MD for:  redness, tenderness, or signs of infection (pain, swelling, redness, odor or green/yellow discharge around incision site)         Follow-up Information    Follow up with Toni Arthurs, MD in 6 days.   Contact information:   [address], [address] [place] [address] [phone]       Follow up with pcp in 9 days.         SignedRicharda Overlie 09/28/2011, 12:40 PM

## 2011-09-28 NOTE — Progress Notes (Signed)
INITIALLY RECEIVED CALL FROM [facility](APRIL) FOR UPDATES ON D/C PLANS.TODAY ONCE D/C PLANS CONFIRMED,THE [facility] STATED THE PATIENT'S PCP IS AT [facility].DR. Toniann Fail HENDERSON & PATIENT MUST GET ALL AUTH FROM HIS PCP.CONTACTED TRANSFER COORDIN AT [facility]-LINDA HUNT WHO ARRANGED PCP APPT,HAS ALL FAXED DATA(H&P,D/C INST,D/C SUMMARY)RECOMMENDED FOR HHPT ONLY.THE [facility] WILL SET UP FOR PATIENT.PATIENT AWARE OF SERVICES FROM THE [place].EVEN HIS APPT W/DR. HEWITT IS NOT COVERED BY THE [facility].HHRN-PER NSG/DR. HEWITT NOT NEEDED FOR 2 WEEKS,THEN MAYBE A HHRN.DR. ABROL UPDATED.

## 2011-09-28 NOTE — Progress Notes (Signed)
Physical Therapy Evaluation Patient Details Name: Bryan Reyes MRN: [id number] DOB: 16-Dec-1950 Today's Date: 09/28/2011 Time: 4098-1191    Eval II Problem List:  Patient Active Problem List  Diagnoses  . Gout  . HTN (hypertension)  . ETOH abuse  . Liver cell injury  . Knee arthropathy  . Foot deformity  . Osteomyelitis of toe of left foot    Past Medical History:  Past Medical History  Diagnosis Date  . Hypertension   . Gout   . Hepatitis C   . Liver disorder     found 2 spots  4 years ago, last report "pretty good"  . Kidney disease     1 spot on kidney, stable at last check 9 months ago  . Knee joint pain 09/23/11    needs replacements on both   Past Surgical History: History reviewed. No pertinent past surgical history.  PT Assessment/Plan/Recommendation PT Assessment Clinical Impression Statement: Pt presents with diagnosis of gout and s/p left foot toe amputation. Pt will benefit from skilled PT in the actue care setting in order to improve gait, activity tolerance, and overall independence with functional mobility in preperation for DC home. PT Recommendation/Assessment: Patient will need skilled PT in the acute care venue PT Problem List: Decreased activity tolerance;Decreased mobility;Pain Barriers to Discharge: Decreased caregiver support PT Therapy Diagnosis : Difficulty walking;Acute pain PT Plan PT Frequency: Min 3X/week PT Treatment/Interventions: DME instruction;Gait training;Functional mobility training;Patient/family education PT Recommendation Recommendations for Other Services: OT consult Follow Up Recommendations: Other (comment);Home health PT Equipment Recommended: Rolling walker with 5" wheels (will provide more stability than 4 wheeled walker, until pt's gait/pain level improves) PT Goals  Acute Rehab PT Goals PT Goal Formulation: With patient Time For Goal Achievement: 7 days Pt will Transfer Sit to Stand/Stand to Sit: with modified  independence;with upper extremity assist Pt will Ambulate: >150 feet;with modified independence;with least restrictive assistive device  PT Evaluation Precautions/Restrictions  Precautions Required Braces or Orthoses: Yes Other Brace/Splint: Post-op shoe L foot Restrictions Weight Bearing Restrictions: Yes LLE Weight Bearing: Weight bearing as tolerated Prior Functioning  Home Living Lives With: Alone Receives Help From: Family;Other (Comment) (sister PRN per pt) Type of Home: House Home Layout: One level Home Access: Ramped entrance Home Adaptive Equipment: Walker - four wheeled;Straight cane (rubbler tip is missing from cane) Prior Function Level of Independence: Requires assistive device for independence Cognition Cognition Arousal/Alertness: Other (comment) (Somewhat drowsy from medication) Overall Cognitive Status: Appears within functional limits for tasks assessed Orientation Level: Oriented X4 Sensation/Coordination   Extremity Assessment RLE Assessment RLE Assessment: Within Functional Limits LLE Assessment LLE Assessment: Not tested Mobility (including Balance) Bed Mobility Bed Mobility: Yes Supine to Sit: 6: Modified independent (Device/Increase time) Sit to Supine - Left: 6: Modified independent (Device/Increase time) Transfers Transfers: Yes Sit to Stand: 4: Min assist;With upper extremity assist Sit to Stand Details (indicate cue type and reason): VCssafety, hand placement. Assist to steady. Stand to Sit: 4: Min assist;To bed Stand to Sit Details: VCs safety, hand placement. Assist to control descent. Ambulation/Gait Ambulation/Gait: Yes Ambulation/Gait Assistance: Other (comment) (Min-guard assist.) Ambulation Distance (Feet): 140 Feet Assistive device: Rolling walker Gait Pattern: Step-to pattern;Decreased weight shift to left;Trunk flexed;Decreased stance time - left  Posture/Postural Control Posture/Postural Control: No significant  limitations Exercise    End of Session PT - End of Session Equipment Utilized During Treatment: Gait belt;Other (comment) (Left post-op shoe) Activity Tolerance: Patient limited by pain Patient left: in bed;with call bell in reach General Behavior  During Session: [facility] for tasks performed Cognition: [facility] for tasks performed  Rebeca Alert [facility] 09/28/2011, 11:35 AM

## 2011-09-28 NOTE — Progress Notes (Signed)
Subjective: 1 Day Post-Op Procedure(s) (LRB): AMPUTATION DIGIT (Left) Patient reports pain as 9 on 0-10 scale.    Objective: Vital signs in last 24 hours: Temp:  [96.7 F (35.9 C)-98.3 F (36.8 C)] 98.1 F (36.7 C) (11/14 0535) Pulse Rate:  [47-57] 54  (11/14 0535) Resp:  [14-20] 16  (11/14 0535) BP: (121-156)/(67-90) 143/86 mmHg (11/14 0535) SpO2:  [95 %-100 %] 95 % (11/14 0535)  Intake/Output from previous day: 11/13 0701 - 11/14 0700 In: 2600 [P.O.:720; I.V.:1530; IV Piggyback:350] Out: 2615 [Urine:2615]   Basename 09/28/11 0515 09/27/11 0441  HGB 11.4* 11.7*    Basename 09/28/11 0515 09/27/11 0441  WBC 7.8 9.6  RBC 3.29* 3.32*  HCT 33.8* 34.0*  PLT 86* 81*    Basename 09/28/11 0515 09/27/11 0441  NA 136 140  K 3.9 3.9  CL 102 107  CO2 30 28  BUN 16 15  CREATININE 1.01 1.14  GLUCOSE 95 92  CALCIUM 8.2* 8.4   No results found for this basename: LABPT:2,INR:2 in the last 72 hours  wound dressed and dry.  wiggles toes.  Assessment/Plan: 1 Day Post-Op Procedure(s) (LRB): AMPUTATION DIGIT (Left) Activity as tolerated.  Bear weight as tolerated in hard sole shoe. Cultures in progress.  Toni Arthurs 09/28/2011, 7:32 AM

## 2011-09-29 ENCOUNTER — Encounter (HOSPITAL_COMMUNITY): Payer: Self-pay | Admitting: Orthopedic Surgery

## 2012-04-01 ENCOUNTER — Encounter (HOSPITAL_COMMUNITY): Payer: Self-pay

## 2012-04-01 DIAGNOSIS — Z79899 Other long term (current) drug therapy: Secondary | ICD-10-CM | POA: Insufficient documentation

## 2012-04-01 DIAGNOSIS — I10 Essential (primary) hypertension: Secondary | ICD-10-CM | POA: Insufficient documentation

## 2012-04-01 DIAGNOSIS — M25519 Pain in unspecified shoulder: Secondary | ICD-10-CM | POA: Insufficient documentation

## 2012-04-01 NOTE — ED Notes (Signed)
Per [facility]- pt presents with no acute distress. denies injury pain to left shoulder for 1 month. "just got tired of dealing with it"- raised red and swollen area "bump"

## 2012-04-02 ENCOUNTER — Emergency Department (HOSPITAL_COMMUNITY): Payer: Non-veteran care

## 2012-04-02 ENCOUNTER — Emergency Department (HOSPITAL_COMMUNITY)
Admission: EM | Admit: 2012-04-02 | Discharge: 2012-04-02 | Disposition: A | Discharge status: Home / Self Care | Payer: Non-veteran care | Attending: Emergency Medicine | Admitting: Emergency Medicine

## 2012-04-02 DIAGNOSIS — M25519 Pain in unspecified shoulder: Secondary | ICD-10-CM

## 2012-04-02 MED ORDER — HYDROMORPHONE HCL PF 1 MG/ML IJ SOLN
1.0000 mg | Freq: Once | INTRAMUSCULAR | Status: AC
  Administered 2012-04-02: 1 mg via INTRAMUSCULAR
  Filled 2012-04-02: qty 1

## 2012-04-02 MED ORDER — HYDROCODONE-ACETAMINOPHEN 5-500 MG PO TABS: 1.0000 | ORAL_TABLET | Freq: Four times a day (QID) | ORAL | Status: AC | PRN

## 2012-04-02 NOTE — ED Notes (Signed)
Pt's BP remains elevated; MD aware; no new orders received.

## 2012-04-02 NOTE — Discharge Instructions (Signed)
Arthralgia Rest and wear a sling as needed for comfort. Take medications as needed. Call today for followup in the clinic as discussed. Evaluated sooner for any worsening condition.   Your caregiver has diagnosed you as suffering from an arthralgia. Arthralgia means there is pain in a joint. This can come from many reasons including:  Bruising the joint which causes soreness (inflammation) in the joint.   Wear and tear on the joints which occur as we grow older (osteoarthritis).   Overusing the joint.   Various forms of arthritis.   Infections of the joint.  Regardless of the cause of pain in your joint, most of these different pains respond to anti-inflammatory drugs and rest. The exception to this is when a joint is infected, and these cases are treated with antibiotics, if it is a bacterial infection. HOME CARE INSTRUCTIONS   Rest the injured area for as long as directed by your caregiver. Then slowly start using the joint as directed by your caregiver and as the pain allows. Crutches as directed may be useful if the ankles, knees or hips are involved. If the knee was splinted or casted, continue use and care as directed. If an stretchy or elastic wrapping bandage has been applied today, it should be removed and re-applied every 3 to 4 hours. It should not be applied tightly, but firmly enough to keep swelling down. Watch toes and feet for swelling, bluish discoloration, coldness, numbness or excessive pain. If any of these problems (symptoms) occur, remove the ace bandage and re-apply more loosely. If these symptoms persist, contact your caregiver or return to this location.   For the first 24 hours, keep the injured extremity elevated on pillows while lying down.   Apply ice for 15 to 20 minutes to the sore joint every couple hours while awake for the first half day. Then 3 to 4 times per day for the first 48 hours. Put the ice in a plastic bag and place a towel between the bag of ice and  your skin.   Wear any splinting, casting, elastic bandage applications, or slings as instructed.   Only take over-the-counter or prescription medicines for pain, discomfort, or fever as directed by your caregiver. Do not use aspirin immediately after the injury unless instructed by your physician. Aspirin can cause increased bleeding and bruising of the tissues.   If you were given crutches, continue to use them as instructed and do not resume weight bearing on the sore joint until instructed.  Persistent pain and inability to use the sore joint as directed for more than 2 to 3 days are warning signs indicating that you should see a caregiver for a follow-up visit as soon as possible. Initially, a hairline fracture (break in bone) may not be evident on X-rays. Persistent pain and swelling indicate that further evaluation, non-weight bearing or use of the joint (use of crutches or slings as instructed), or further X-rays are indicated. X-rays may sometimes not show a small fracture until a week or 10 days later. Make a follow-up appointment with your own caregiver or one to whom we have referred you. A radiologist (specialist in reading X-rays) may read your X-rays. Make sure you know how you are to obtain your X-ray results. Do not assume everything is normal if you do not hear from [place]. SEEK MEDICAL CARE IF: Bruising, swelling, or pain increases. SEEK IMMEDIATE MEDICAL CARE IF:   Your fingers or toes are numb or blue.   The pain  is not responding to medications and continues to stay the same or get worse.   The pain in your joint becomes severe.   You develop a fever over 102 F (38.9 C).   It becomes impossible to move or use the joint.  MAKE SURE YOU:   Understand these instructions.   Will watch your condition.   Will get help right away if you are not doing well or get worse.

## 2012-04-02 NOTE — ED Notes (Signed)
Pt's sister Corrie Dandy contacted for transportation home. Will arrive to ED shortly.

## 2012-04-02 NOTE — ED Notes (Signed)
In to medicate pt, pt sleeping soundly, RR even and unlabored, sling and ice pack in place

## 2012-04-02 NOTE — ED Notes (Signed)
Pt awakened, c/o pain, pt medicated per MD order

## 2012-04-03 NOTE — ED Provider Notes (Signed)
History     CSN: [id number]  Arrival date & time 04/01/12  2215   First MD Initiated Contact with Patient 04/02/12 0013      Chief Complaint  Patient presents with  . Shoulder Pain    (Consider location/radiation/quality/duration/timing/severity/associated sxs/prior treatment) HPI History provided by patient. Left shoulder pain for at least a month now. Followed by the [facility] and has seen local orthopedic surgeon for shoulder issues in the past. He denies any trauma. Hurts to move. No fevers. Has history of gout and liver problems.  He denies any recent alcohol use. Having trouble sleeping tonight the pain.  He denies any weakness or numbness. No radiation of sharp pain from left anterior shoulder. Past Medical History  Diagnosis Date  . Hypertension   . Gout   . Hepatitis C   . Liver disorder     found 2 spots  4 years ago, last report "pretty good"  . Kidney disease     1 spot on kidney, stable at last check 9 months ago  . Knee joint pain 09/23/11    needs replacements on both    Past Surgical History  Procedure Date  . Amputation 09/27/2011    Procedure: AMPUTATION DIGIT;  Surgeon: Toni Arthurs, MD;  Location: [facility];  Service: Orthopedics;  Laterality: Left;  Left second toe amputation    Family History  Problem Relation Age of Onset  . Diabetes Mother   . Hypertension Mother   . Diabetes Father   . Hypertension Father     History  Substance Use Topics  . Smoking status: Current Everyday Smoker  . Smokeless tobacco: Never Used  . Alcohol Use: 0.6 oz/week    1 Cans of beer per week     1 to 2 beers daily      Review of Systems  Constitutional: Negative for fever and chills.  HENT: Negative for neck pain and neck stiffness.   Eyes: Negative for pain.  Respiratory: Negative for shortness of breath.   Cardiovascular: Negative for chest pain.  Gastrointestinal: Negative for abdominal pain.  Genitourinary: Negative for dysuria.  Musculoskeletal:  Negative for back pain.  Skin: Negative for rash.  Neurological: Negative for headaches.  All other systems reviewed and are negative.    Allergies  Review of patient's allergies indicates no known allergies.  Home Medications   Current Outpatient Rx  Name Route Sig Dispense Refill  . AMLODIPINE BESYLATE 10 MG PO TABS Oral Take 10 mg by mouth daily.      Marland Kitchen CARVEDILOL 6.25 MG PO TABS Oral Take 6.25 mg by mouth 2 (two) times daily with a meal.      . FUROSEMIDE 20 MG PO TABS Oral Take 20 mg by mouth daily.      Marland Kitchen HYDROCODONE-ACETAMINOPHEN 5-500 MG PO TABS Oral Take 1 tablet by mouth every 6 (six) hours as needed for pain. 5 tablet 0  . KETOCONAZOLE 2 % EX CREA Topical Apply 1 application topically 2 (two) times daily as needed. Apply to itchy areas on legs/arms     . MICONAZOLE NITRATE 2 % EX POWD Topical Apply 1 application topically 2 (two) times daily as needed. Apply to feet between toes but not to ulcer.     Carma Leaven M PLUS PO TABS Oral Take 1 tablet by mouth daily.      Marland Kitchen NAPROXEN 500 MG PO TABS Oral Take 500 mg by mouth 2 (two) times daily with a meal.      .  OMEPRAZOLE 20 MG PO CPDR Oral Take 20 mg by mouth daily.      Marland Kitchen PREDNISONE (PAK) 10 MG PO TABS Oral Take 10 mg by mouth daily.      Marland Kitchen SPIRONOLACTONE 25 MG PO TABS Oral Take 50 mg by mouth daily.        BP 150/92  Pulse 77  Temp(Src) 99.5 F (37.5 C) (Oral)  Resp 18  SpO2 99%  Physical Exam  Constitutional: He is oriented to person, place, and time. He appears well-developed and well-nourished.  HENT:  Head: Normocephalic and atraumatic.  Eyes: Conjunctivae and EOM are normal. Pupils are equal, round, and reactive to light.  Neck: Trachea normal. Neck supple. No thyromegaly present.  Cardiovascular: Normal rate, regular rhythm, S1 normal, S2 normal and normal pulses.     No systolic murmur is present   No diastolic murmur is present  Pulses:      Radial pulses are 2+ on the right side, and 2+ on the left side.    Pulmonary/Chest: Effort normal and breath sounds normal. He has no wheezes. He has no rhonchi. He has no rales. He exhibits no tenderness.  Abdominal: Soft. Normal appearance and bowel sounds are normal. There is no tenderness. There is no CVA tenderness and negative Murphy's sign.  Musculoskeletal:       Left upper extremity: Tender over anterior shoulder with small area of erythema and swelling. No underlying fluctuance. No pointing. No obvious bony deformity. Distal neurovascular intact left upper extremity with equal grips, biceps and triceps strengths, sensorium to light touch throughout equal radial pulses.  Neurological: He is alert and oriented to person, place, and time. He has normal strength. No cranial nerve deficit or sensory deficit. GCS eye subscore is 4. GCS verbal subscore is 5. GCS motor subscore is 6.  Skin: Skin is warm and dry. No rash noted. He is not diaphoretic.  Psychiatric: His speech is normal.       Cooperative and appropriate    ED Course  Procedures (including critical care time)  Labs Reviewed - No data to display Dg Shoulder Left  04/02/2012  *RADIOLOGY REPORT*  Clinical Data: Left shoulder pain  LEFT SHOULDER - 2+ VIEW  Comparison: None.  Findings: Left upper lung is clear.  Suboptimal scapular Y view. Apparent mild posterior subluxation is favored to be secondary to technique/positioning. Correlate with range of motion.  There is no complete dislocation. Oval calcific densities within the soft tissues overlying the right shoulder are nonspecific.  IMPRESSION: No acute osseous abnormality. See above.  Nonspecific oval calcific densities projecting within the soft tissues overlying the left shoulder.  Original Report Authenticated By: Waneta Martins, M.D.     1. Shoulder pain     Dilaudid IM, x-rays as above, sling provided for comfort.  MDM   The shoulder pain improved with medications, no acute bony abnormality by x-rays above, plan sling pain  medications and orthopedic followup as an outpatient.        Sunnie Nielsen, MD 04/03/12 [phone]

## 2014-01-06 ENCOUNTER — Encounter (HOSPITAL_COMMUNITY): Payer: Self-pay | Admitting: Emergency Medicine

## 2014-01-06 ENCOUNTER — Emergency Department (HOSPITAL_COMMUNITY)
Admission: EM | Admit: 2014-01-06 | Discharge: 2014-01-07 | Disposition: A | Discharge status: Home / Self Care | Payer: Non-veteran care | Attending: Emergency Medicine | Admitting: Emergency Medicine

## 2014-01-06 DIAGNOSIS — F17200 Nicotine dependence, unspecified, uncomplicated: Secondary | ICD-10-CM | POA: Insufficient documentation

## 2014-01-06 DIAGNOSIS — R7402 Elevation of levels of lactic acid dehydrogenase (LDH): Secondary | ICD-10-CM | POA: Insufficient documentation

## 2014-01-06 DIAGNOSIS — Z8619 Personal history of other infectious and parasitic diseases: Secondary | ICD-10-CM | POA: Insufficient documentation

## 2014-01-06 DIAGNOSIS — L989 Disorder of the skin and subcutaneous tissue, unspecified: Secondary | ICD-10-CM

## 2014-01-06 DIAGNOSIS — S91309A Unspecified open wound, unspecified foot, initial encounter: Secondary | ICD-10-CM | POA: Insufficient documentation

## 2014-01-06 DIAGNOSIS — D539 Nutritional anemia, unspecified: Secondary | ICD-10-CM | POA: Insufficient documentation

## 2014-01-06 DIAGNOSIS — R740 Nonspecific elevation of levels of transaminase and lactic acid dehydrogenase [LDH]: Secondary | ICD-10-CM

## 2014-01-06 DIAGNOSIS — R209 Unspecified disturbances of skin sensation: Secondary | ICD-10-CM | POA: Insufficient documentation

## 2014-01-06 DIAGNOSIS — M79672 Pain in left foot: Secondary | ICD-10-CM

## 2014-01-06 DIAGNOSIS — E871 Hypo-osmolality and hyponatremia: Secondary | ICD-10-CM | POA: Insufficient documentation

## 2014-01-06 DIAGNOSIS — Z8719 Personal history of other diseases of the digestive system: Secondary | ICD-10-CM | POA: Insufficient documentation

## 2014-01-06 DIAGNOSIS — W268XXA Contact with other sharp object(s), not elsewhere classified, initial encounter: Secondary | ICD-10-CM | POA: Insufficient documentation

## 2014-01-06 DIAGNOSIS — Z79899 Other long term (current) drug therapy: Secondary | ICD-10-CM | POA: Insufficient documentation

## 2014-01-06 DIAGNOSIS — R202 Paresthesia of skin: Secondary | ICD-10-CM

## 2014-01-06 DIAGNOSIS — Z87448 Personal history of other diseases of urinary system: Secondary | ICD-10-CM | POA: Insufficient documentation

## 2014-01-06 DIAGNOSIS — I10 Essential (primary) hypertension: Secondary | ICD-10-CM | POA: Insufficient documentation

## 2014-01-06 DIAGNOSIS — F4322 Adjustment disorder with anxiety: Secondary | ICD-10-CM | POA: Diagnosis present

## 2014-01-06 DIAGNOSIS — R7401 Elevation of levels of liver transaminase levels: Secondary | ICD-10-CM | POA: Insufficient documentation

## 2014-01-06 DIAGNOSIS — F1010 Alcohol abuse, uncomplicated: Secondary | ICD-10-CM | POA: Insufficient documentation

## 2014-01-06 DIAGNOSIS — Y929 Unspecified place or not applicable: Secondary | ICD-10-CM | POA: Insufficient documentation

## 2014-01-06 DIAGNOSIS — Y9389 Activity, other specified: Secondary | ICD-10-CM | POA: Insufficient documentation

## 2014-01-06 LAB — COMPREHENSIVE METABOLIC PANEL
ALBUMIN: 2 g/dL — AB (ref 3.5–5.2)
ALK PHOS: 276 U/L — AB (ref 39–117)
ALT: 59 U/L — AB (ref 0–53)
AST: 99 U/L — AB (ref 0–37)
BUN: 13 mg/dL (ref 6–23)
CO2: 23 mEq/L (ref 19–32)
Calcium: 8.5 mg/dL (ref 8.4–10.5)
Chloride: 97 mEq/L (ref 96–112)
Creatinine, Ser: 0.95 mg/dL (ref 0.50–1.35)
GFR calc Af Amer: >90 mL/min (ref >90)
GFR calc non Af Amer: 88 mL/min — ABNORMAL LOW (ref >90)
Glucose, Bld: 91 mg/dL (ref 70–99)
POTASSIUM: 4.1 meq/L (ref 3.7–5.3)
SODIUM: 131 meq/L — AB (ref 137–147)
TOTAL PROTEIN: 8.1 g/dL (ref 6.0–8.3)
Total Bilirubin: 2 mg/dL — ABNORMAL HIGH (ref 0.3–1.2)

## 2014-01-06 LAB — CBC WITH DIFFERENTIAL/PLATELET
Basophils Absolute: 0 10*3/uL (ref 0.0–0.1)
Basophils Relative: 0 % (ref 0–1)
EOS PCT: 1 % (ref 0–5)
Eosinophils Absolute: 0.1 10*3/uL (ref 0.0–0.7)
HEMATOCRIT: 33.8 % — AB (ref 39.0–52.0)
Hemoglobin: 11.8 g/dL — ABNORMAL LOW (ref 13.0–17.0)
LYMPHS ABS: 2.8 10*3/uL (ref 0.7–4.0)
LYMPHS PCT: 28 % (ref 12–46)
MCH: 35 pg — ABNORMAL HIGH (ref 26.0–34.0)
MCHC: 34.9 g/dL (ref 30.0–36.0)
MCV: 100.3 fL — AB (ref 78.0–100.0)
MONO ABS: 1.1 10*3/uL — AB (ref 0.1–1.0)
Monocytes Relative: 11 % (ref 3–12)
NEUTROS ABS: 6.2 10*3/uL (ref 1.7–7.7)
Neutrophils Relative %: 61 % (ref 43–77)
PLATELETS: 136 10*3/uL — AB (ref 150–400)
RBC: 3.37 MIL/uL — AB (ref 4.22–5.81)
RDW: 16.2 % — ABNORMAL HIGH (ref 11.5–15.5)
WBC: 10.1 10*3/uL (ref 4.0–10.5)

## 2014-01-06 LAB — AMMONIA: Ammonia: 47 umol/L (ref 11–60)

## 2014-01-06 LAB — PROTIME-INR
INR: 1.13 (ref 0.00–1.49)
Prothrombin Time: 14.3 seconds (ref 11.6–15.2)

## 2014-01-06 MED ORDER — SODIUM CHLORIDE 0.9 % IV BOLUS (SEPSIS)
1000.0000 mL | Freq: Once | INTRAVENOUS | Status: AC
  Administered 2014-01-07: 1000 mL via INTRAVENOUS

## 2014-01-06 MED ORDER — HYDROXYZINE HCL 25 MG PO TABS
25.0000 mg | ORAL_TABLET | Freq: Once | ORAL | Status: AC
  Administered 2014-01-06: 25 mg via ORAL
  Filled 2014-01-06: qty 1

## 2014-01-06 NOTE — ED Notes (Signed)
Bed: LE75 Expected date:  Expected time:  Means of arrival:  Comments: EMS 88 M foot lac/ HTN
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
## 2014-01-07 ENCOUNTER — Encounter (HOSPITAL_COMMUNITY): Payer: Self-pay | Admitting: Registered Nurse

## 2014-01-07 ENCOUNTER — Emergency Department (HOSPITAL_COMMUNITY): Payer: Non-veteran care

## 2014-01-07 DIAGNOSIS — L989 Disorder of the skin and subcutaneous tissue, unspecified: Secondary | ICD-10-CM

## 2014-01-07 DIAGNOSIS — M79609 Pain in unspecified limb: Secondary | ICD-10-CM

## 2014-01-07 DIAGNOSIS — F4322 Adjustment disorder with anxiety: Secondary | ICD-10-CM

## 2014-01-07 LAB — ETHANOL: Alcohol, Ethyl (B): <11 mg/dL (ref 0–11)

## 2014-01-07 LAB — SALICYLATE LEVEL

## 2014-01-07 LAB — ACETAMINOPHEN LEVEL

## 2014-01-07 LAB — LIPASE, BLOOD: Lipase: 81 U/L — ABNORMAL HIGH (ref 11–59)

## 2014-01-07 LAB — URIC ACID: URIC ACID, SERUM: 7.4 mg/dL (ref 4.0–7.8)

## 2014-01-07 MED ORDER — SPIRONOLACTONE 50 MG PO TABS
50.0000 mg | ORAL_TABLET | Freq: Every day | ORAL | Status: DC
  Administered 2014-01-07: 50 mg via ORAL
  Filled 2014-01-07: qty 1

## 2014-01-07 MED ORDER — FUROSEMIDE 20 MG PO TABS
20.0000 mg | ORAL_TABLET | Freq: Every day | ORAL | Status: DC
  Administered 2014-01-07: 20 mg via ORAL
  Filled 2014-01-07: qty 1

## 2014-01-07 MED ORDER — BISACODYL 5 MG PO TBEC
5.0000 mg | DELAYED_RELEASE_TABLET | Freq: Every day | ORAL | Status: DC | PRN
  Filled 2014-01-07: qty 1

## 2014-01-07 MED ORDER — SODIUM CHLORIDE 0.9 % IV BOLUS (SEPSIS)
1000.0000 mL | Freq: Once | INTRAVENOUS | Status: AC
  Administered 2014-01-07: 1000 mL via INTRAVENOUS

## 2014-01-07 MED ORDER — CHOLESTYRAMINE LIGHT 4 G PO PACK
4.0000 g | PACK | Freq: Two times a day (BID) | ORAL | Status: DC
Start: 2014-01-07 — End: 2014-01-07
  Administered 2014-01-07: 4 g via ORAL
  Filled 2014-01-07 (×2): qty 1

## 2014-01-07 MED ORDER — AMLODIPINE BESYLATE 10 MG PO TABS
10.0000 mg | ORAL_TABLET | Freq: Every day | ORAL | Status: DC
  Administered 2014-01-07: 10 mg via ORAL
  Filled 2014-01-07: qty 1

## 2014-01-07 MED ORDER — DOCUSATE SODIUM 100 MG PO CAPS
100.0000 mg | ORAL_CAPSULE | Freq: Two times a day (BID) | ORAL | Status: DC
  Administered 2014-01-07: 100 mg via ORAL
  Filled 2014-01-07: qty 1

## 2014-01-07 MED ORDER — IBUPROFEN 200 MG PO TABS
600.0000 mg | ORAL_TABLET | Freq: Once | ORAL | Status: AC
  Administered 2014-01-07: 600 mg via ORAL
  Filled 2014-01-07: qty 3

## 2014-01-07 MED ORDER — CARVEDILOL 6.25 MG PO TABS
6.2500 mg | ORAL_TABLET | Freq: Two times a day (BID) | ORAL | Status: DC
  Administered 2014-01-07: 6.25 mg via ORAL
  Filled 2014-01-07 (×3): qty 1

## 2014-01-07 MED ORDER — PERMETHRIN 5 % EX CREA: TOPICAL_CREAM | CUTANEOUS | Status: DC

## 2014-01-07 MED ORDER — MORPHINE SULFATE 2 MG/ML IJ SOLN
2.0000 mg | Freq: Once | INTRAMUSCULAR | Status: AC
  Administered 2014-01-07: 2 mg via INTRAVENOUS
  Filled 2014-01-07: qty 1

## 2014-01-07 MED ORDER — IBUPROFEN 800 MG PO TABS: 800.0000 mg | ORAL_TABLET | Freq: Three times a day (TID) | ORAL | Status: DC | PRN

## 2014-01-07 NOTE — ED Provider Notes (Signed)
Patient has been evaluated by psychiatry and cleared for discharge. Patient to be treated for bed bugs  Leota Jacobsen, MD 01/07/14 1224

## 2014-01-07 NOTE — Progress Notes (Signed)
Per discussion with psychiatrist and NP, patient psychiatrically stable for discharge home. Per psychiatrist, patient may need further medical evaluation. CSW informed EDP.   Noreene Larsson [phone]  ED CSW 01/07/2014 1221pm

## 2014-01-07 NOTE — ED Notes (Signed)
Pt is unable to void at this time.  

## 2014-01-07 NOTE — ED Provider Notes (Signed)
Medical screening examination/treatment/procedure(s) were performed by non-physician practitioner and as supervising physician I was immediately available for consultation/collaboration.    Teressa Lower, MD 01/07/14 [phone]

## 2014-01-07 NOTE — Consult Note (Signed)
Face to face evaluation and I agree with this note 
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
## 2014-01-07 NOTE — ED Provider Notes (Signed)
CSN: [id number]     Arrival date & time 01/06/14  2022 History   First MD Initiated Contact with Patient 01/06/14 2129     Chief Complaint  Patient presents with  . Laceration     (Consider location/radiation/quality/duration/timing/severity/associated sxs/prior Treatment) HPI 63 yo male presents to ED with c/o LEFT foot laceration. Patient states he was recently incarcerated and released at the end of December. Patient states he has been staying at a extended stay hotel. Patient states he has developed a pruritic rash since he has been staying at the extended stay hotel. Patient states he saw "black bugs on the bed sheets". Patient states he has been having bug crawling on him. Patient states he tried to scrape them off with a razor on his left foot when he cut himself and was unable to control the bleeding. Patient states he feels bugs crawling under his skin. Patient states "I squeeze them, then they turn white and get hard as a brick. I'll take a lighter and burn them... I'll pull them out with a pair of tweezers and it will leave a hole". Patient admits to drinking multiple "sparks lemonade" today and some "liquor". PMH significant for HTN, Gout, Hepatitis C, and Bipolar Disorder. Patient states he is not currently taking any medication for his Bipolar. Patient is a English as a second language teacher and goes to the [employer] in [place], [place]. Patient is a daily drinker. Patient Denies SI or HI. Denies auditory hallucinations. Patient states "Every night it never fails, I'll see someone outside my window listening to my conversations".  Past Medical History  Diagnosis Date  . Hypertension   . Gout   . Hepatitis C   . Liver disorder     found 2 spots  4 years ago, last report "pretty good"  . Kidney disease     1 spot on kidney, stable at last check 9 months ago  . Knee joint pain 09/23/11    needs replacements on both   Past Surgical History  Procedure Laterality Date  . Amputation  09/27/2011    Procedure: AMPUTATION  DIGIT;  Surgeon: Wylene Simmer, MD;  Location: [facility];  Service: Orthopedics;  Laterality: Left;  Left second toe amputation   Family History  Problem Relation Age of Onset  . Diabetes Mother   . Hypertension Mother   . Diabetes Father   . Hypertension Father    History  Substance Use Topics  . Smoking status: Current Every Day Smoker  . Smokeless tobacco: Never Used  . Alcohol Use: 0.6 oz/week    1 Cans of beer per week     Comment: 1 to 2 beers daily    Review of Systems  All other systems reviewed and are negative.      Allergies  Review of patient's allergies indicates no known allergies.  Home Medications   Current Outpatient Rx  Name  Route  Sig  Dispense  Refill  . amLODipine (NORVASC) 10 MG tablet   Oral   Take 10 mg by mouth daily.           . bisacodyl (DULCOLAX) 5 MG EC tablet   Oral   Take 5 mg by mouth daily as needed for moderate constipation.         . carvedilol (COREG) 6.25 MG tablet   Oral   Take 6.25 mg by mouth 2 (two) times daily with a meal.           . cholestyramine light (PREVALITE) 4 G packet  Oral   Take 4 g by mouth 2 (two) times daily.         Marland Kitchen docusate sodium (COLACE) 100 MG capsule   Oral   Take 100 mg by mouth 2 (two) times daily.         . furosemide (LASIX) 20 MG tablet   Oral   Take 20 mg by mouth daily.           Marland Kitchen ibuprofen (ADVIL,MOTRIN) 800 MG tablet   Oral   Take 800 mg by mouth every 8 (eight) hours as needed for moderate pain.         . Multiple Vitamins-Minerals (MULTIVITAMINS THER. W/MINERALS) TABS   Oral   Take 1 tablet by mouth daily.           Marland Kitchen spironolactone (ALDACTONE) 25 MG tablet   Oral   Take 50 mg by mouth daily.           Marland Kitchen ketoconazole (NIZORAL) 2 % cream   Topical   Apply 1 application topically 2 (two) times daily as needed. Apply to itchy areas on legs/arms          . miconazole (MICOTIN) 2 % powder   Topical   Apply 1 application topically 2 (two) times daily as  needed. Apply to feet between toes but not to ulcer.           BP 145/84  Pulse 94  Temp(Src) 98.3 F (36.8 C) (Oral)  Resp 17  SpO2 100%  Physical Exam  Nursing note and vitals reviewed. Constitutional: He is oriented to person, place, and time. He appears well-developed and well-nourished. No distress.  HENT:  Head: Normocephalic and atraumatic.  Eyes: Conjunctivae and EOM are normal. Pupils are equal, round, and reactive to light. Scleral icterus is present.  Neck: Normal range of motion. Neck supple. No JVD present. No tracheal deviation present.  Cardiovascular: Normal rate, regular rhythm and intact distal pulses.   Pulmonary/Chest: Effort normal and breath sounds normal. No respiratory distress. He has no wheezes. He has no rhonchi. He has no rales.  Abdominal: Soft. Bowel sounds are normal. He exhibits no distension. There is no hepatosplenomegaly. There is no tenderness. There is no rigidity, no rebound, no guarding, no tenderness at McBurney's point and negative Murphy's sign.  Musculoskeletal: Normal range of motion. He exhibits no edema.       Feet:  Neurological: He is alert and oriented to person, place, and time. He has normal strength. No cranial nerve deficit or sensory deficit.  No asterixis on exam.   Skin: Skin is warm and dry. Lesion (diffuse scattered lesions with appearance resembling cigarette burns, consistent with patient hx of attempt to burn "bugs" on his skin) noted. He is not diaphoretic.  Psychiatric: He has a normal mood and affect. His behavior is normal. He expresses no homicidal and no suicidal ideation. He expresses no suicidal plans and no homicidal plans.    ED Course  Procedures (including critical care time) Labs Review Labs Reviewed  COMPREHENSIVE METABOLIC PANEL - Abnormal; Notable for the following:    Sodium 131 (*)    Albumin 2.0 (*)    AST 99 (*)    ALT 59 (*)    Alkaline Phosphatase 276 (*)    Total Bilirubin 2.0 (*)    GFR calc  non Af Amer 88 (*)    All other components within normal limits  CBC WITH DIFFERENTIAL - Abnormal; Notable for the following:  RBC 3.37 (*)    Hemoglobin 11.8 (*)    HCT 33.8 (*)    MCV 100.3 (*)    MCH 35.0 (*)    RDW 16.2 (*)    Platelets 136 (*)    Monocytes Absolute 1.1 (*)    All other components within normal limits  LIPASE, BLOOD - Abnormal; Notable for the following:    Lipase 81 (*)    All other components within normal limits  SALICYLATE LEVEL - Abnormal; Notable for the following:    Salicylate Lvl 123456 (*)    All other components within normal limits  PROTIME-INR  AMMONIA  ETHANOL  ACETAMINOPHEN LEVEL  URIC ACID  URINE RAPID DRUG SCREEN (HOSP PERFORMED)   Imaging Review Dg Foot Complete Left  01/07/2014   CLINICAL DATA:  Injury to left foot from razor, with laceration along the fourth and fifth metatarsals.  EXAM: LEFT FOOT - COMPLETE 3+ VIEW  COMPARISON:  Left toe radiographs performed 09/23/2011  FINDINGS: There is no evidence of fracture or dislocation. The known soft tissue laceration is not well characterized, though an overlying dressing is seen. No radiopaque foreign bodies are identified.  There appears to be degeneration at the subtalar joint, likely reflecting mild changes of Charcot joint. There is chronic absence of the second toe, reflecting prior amputation. An os peroneum is noted.  Scattered vascular calcifications are noted.  IMPRESSION: 1. No evidence of fracture or dislocation. No radiopaque foreign bodies seen. 2. Changes suggestive of mild Charcot joint at the subtalar joint; scattered vascular calcifications seen.   Electronically Signed   By: Garald Balding M.D.   On: 01/07/2014 01:10    EKG Interpretation   None       MDM   Final diagnoses:  Left foot pain  Transaminitis  Hyponatremia  Formication  ETOH abuse  Macrocytic anemia    Patient afebrile.   Hyponatremia at 131. Patient given IV fluids  Transaminitis. Patient has hx of  Hepatitis C and consumes alcohol daily.  Ammonia Level WNL Chronic macrocytic anemia, likely secondary to chronic alcohol use  Thrombocytopenia appears stable Mildly elevated Lipase at 81 LEFT foot plain films negative for acute fracture or dislocation.  Uric acid pending.   Patient medically clear by my examination. TTS consulted. Patient currently awaiting TTS evaluation.              Dossie Arbour Arpelar, PA-C 01/07/14 [phone]

## 2014-01-07 NOTE — Discharge Instructions (Signed)
Scabies  Scabies are small bugs (mites) that burrow under the skin and cause red bumps and severe itching. These bugs can only be seen with a microscope. Scabies are highly contagious. They can spread easily from person to person by direct contact. They are also spread through sharing clothing or linens that have the scabies mites living in them. It is not unusual for an entire family to become infected through shared towels, clothing, or bedding.   HOME CARE INSTRUCTIONS   · Your caregiver may prescribe a cream or lotion to kill the mites. If cream is prescribed, massage the cream into the entire body from the neck to the bottom of both feet. Also massage the cream into the scalp and face if your child is less than 1 year old. Avoid the eyes and mouth. Do not wash your hands after application.  · Leave the cream on for 8 to 12 hours. Your child should bathe or shower after the 8 to 12 hour application period. Sometimes it is helpful to apply the cream to your child right before bedtime.  · One treatment is usually effective and will eliminate approximately 95% of infestations. For severe cases, your caregiver may decide to repeat the treatment in 1 week. Everyone in your household should be treated with one application of the cream.  · New rashes or burrows should not appear within 24 to 48 hours after successful treatment. However, the itching and rash may last for 2 to 4 weeks after successful treatment. Your caregiver may prescribe a medicine to help with the itching or to help the rash go away more quickly.  · Scabies can live on clothing or linens for up to 3 days. All of your child's recently used clothing, towels, stuffed toys, and bed linens should be washed in hot water and then dried in a dryer for at least 20 minutes on high heat. Items that cannot be washed should be enclosed in a plastic bag for at least 3 days.  · To help relieve itching, bathe your child in a cool bath or apply cool washcloths to the  affected areas.  · Your child may return to school after treatment with the prescribed cream.  SEEK MEDICAL CARE IF:   · The itching persists longer than 4 weeks after treatment.  · The rash spreads or becomes infected. Signs of infection include red blisters or yellow-tan crust.  Document Released: 10/31/2005 Document Revised: 01/23/2012 Document Reviewed: 03/11/2009  Exit[employer]® Patient Information ©2014 [employer].

## 2014-06-20 ENCOUNTER — Emergency Department (HOSPITAL_COMMUNITY)
Admission: EM | Admit: 2014-06-20 | Discharge: 2014-06-20 | Disposition: A | Discharge status: Home / Self Care | Payer: Non-veteran care | Attending: Emergency Medicine | Admitting: Emergency Medicine

## 2014-06-20 ENCOUNTER — Encounter (HOSPITAL_COMMUNITY): Payer: Self-pay | Admitting: Emergency Medicine

## 2014-06-20 ENCOUNTER — Emergency Department (HOSPITAL_COMMUNITY): Payer: Non-veteran care

## 2014-06-20 DIAGNOSIS — M109 Gout, unspecified: Secondary | ICD-10-CM | POA: Insufficient documentation

## 2014-06-20 DIAGNOSIS — N289 Disorder of kidney and ureter, unspecified: Secondary | ICD-10-CM | POA: Insufficient documentation

## 2014-06-20 DIAGNOSIS — I10 Essential (primary) hypertension: Secondary | ICD-10-CM | POA: Insufficient documentation

## 2014-06-20 DIAGNOSIS — F17200 Nicotine dependence, unspecified, uncomplicated: Secondary | ICD-10-CM | POA: Insufficient documentation

## 2014-06-20 DIAGNOSIS — Z8719 Personal history of other diseases of the digestive system: Secondary | ICD-10-CM | POA: Insufficient documentation

## 2014-06-20 DIAGNOSIS — Z8619 Personal history of other infectious and parasitic diseases: Secondary | ICD-10-CM | POA: Insufficient documentation

## 2014-06-20 DIAGNOSIS — M79609 Pain in unspecified limb: Secondary | ICD-10-CM | POA: Insufficient documentation

## 2014-06-20 DIAGNOSIS — Z79899 Other long term (current) drug therapy: Secondary | ICD-10-CM | POA: Insufficient documentation

## 2014-06-20 LAB — CBC WITH DIFFERENTIAL/PLATELET
Basophils Absolute: 0 10*3/uL (ref 0.0–0.1)
Basophils Relative: 0 % (ref 0–1)
Eosinophils Absolute: 0.1 10*3/uL (ref 0.0–0.7)
Eosinophils Relative: 1 % (ref 0–5)
HEMATOCRIT: 39.8 % (ref 39.0–52.0)
HEMOGLOBIN: 14.1 g/dL (ref 13.0–17.0)
LYMPHS ABS: 2.4 10*3/uL (ref 0.7–4.0)
Lymphocytes Relative: 18 % (ref 12–46)
MCH: 36.2 pg — ABNORMAL HIGH (ref 26.0–34.0)
MCHC: 35.4 g/dL (ref 30.0–36.0)
MCV: 102.1 fL — AB (ref 78.0–100.0)
Monocytes Absolute: 2.4 10*3/uL — ABNORMAL HIGH (ref 0.1–1.0)
Monocytes Relative: 18 % — ABNORMAL HIGH (ref 3–12)
NEUTROS ABS: 8.3 10*3/uL — AB (ref 1.7–7.7)
Neutrophils Relative %: 63 % (ref 43–77)
Platelets: 181 10*3/uL (ref 150–400)
RBC: 3.9 MIL/uL — ABNORMAL LOW (ref 4.22–5.81)
RDW: 15 % (ref 11.5–15.5)
WBC: 13.2 10*3/uL — AB (ref 4.0–10.5)

## 2014-06-20 LAB — I-STAT CHEM 8, ED
BUN: 13 mg/dL (ref 6–23)
BUN: 18 mg/dL (ref 6–23)
CALCIUM ION: 0.93 mmol/L — AB (ref 1.13–1.30)
CALCIUM ION: 1.06 mmol/L — AB (ref 1.13–1.30)
CHLORIDE: 108 meq/L (ref 96–112)
Chloride: 105 mEq/L (ref 96–112)
Creatinine, Ser: 0.9 mg/dL (ref 0.50–1.35)
Creatinine, Ser: 1 mg/dL (ref 0.50–1.35)
GLUCOSE: 110 mg/dL — AB (ref 70–99)
Glucose, Bld: 116 mg/dL — ABNORMAL HIGH (ref 70–99)
HCT: 47 % (ref 39.0–52.0)
HEMATOCRIT: 48 % (ref 39.0–52.0)
HEMOGLOBIN: 16.3 g/dL (ref 13.0–17.0)
HEMOGLOBIN: 16 g/dL (ref 13.0–17.0)
Potassium: 4.1 mEq/L (ref 3.7–5.3)
Potassium: 5.8 mEq/L — ABNORMAL HIGH (ref 3.7–5.3)
Sodium: 135 mEq/L — ABNORMAL LOW (ref 137–147)
Sodium: 138 mEq/L (ref 137–147)
TCO2: 20 mmol/L (ref 0–100)
TCO2: 25 mmol/L (ref 0–100)

## 2014-06-20 LAB — LIPASE, BLOOD: LIPASE: 40 U/L (ref 11–59)

## 2014-06-20 MED ORDER — HYDROMORPHONE HCL PF 1 MG/ML IJ SOLN
1.0000 mg | Freq: Once | INTRAMUSCULAR | Status: AC
  Administered 2014-06-20: 1 mg via INTRAVENOUS
  Filled 2014-06-20: qty 1

## 2014-06-20 MED ORDER — METHYLPREDNISOLONE SODIUM SUCC 125 MG IJ SOLR
125.0000 mg | Freq: Once | INTRAMUSCULAR | Status: AC
  Administered 2014-06-20: 125 mg via INTRAVENOUS
  Filled 2014-06-20: qty 2

## 2014-06-20 MED ORDER — FENTANYL CITRATE 0.05 MG/ML IJ SOLN
50.0000 ug | Freq: Once | INTRAMUSCULAR | Status: AC
  Administered 2014-06-20: 50 ug via INTRAVENOUS
  Filled 2014-06-20: qty 2

## 2014-06-20 MED ORDER — PREDNISONE 20 MG PO TABS: 40.0000 mg | ORAL_TABLET | Freq: Every day | ORAL | Status: DC

## 2014-06-20 NOTE — ED Notes (Signed)
Triage reported that the patient did not come into the ED with a cane.

## 2014-06-20 NOTE — ED Notes (Signed)
Patient transported to X-ray 

## 2014-06-20 NOTE — ED Notes (Signed)
Pt refused lab work until he gets pain meds.

## 2014-06-20 NOTE — ED Provider Notes (Signed)
CSN: [id number]     Arrival date & time 06/20/14  1630 History   First MD Initiated Contact with Patient 06/20/14 1646     Chief Complaint  Patient presents with  . gout pain, right arm      (Consider location/radiation/quality/duration/timing/severity/associated sxs/prior Treatment) HPI Comments: Patient presents to the emergency department with chief complaint of gout flare in his right hand, wrist, and elbow. He states that he has a history of gout. He states that this started on Tuesday. States it is progressively worsened. He is tried taking oxycodone with minimal relief. He denies any fevers, chills, nausea, or vomiting. He has a history of the same. The pain is worsened with movement.  The history is provided by the patient. No language interpreter was used.    Past Medical History  Diagnosis Date  . Hypertension   . Gout   . Hepatitis C   . Liver disorder     found 2 spots  4 years ago, last report "pretty good"  . Kidney disease     1 spot on kidney, stable at last check 9 months ago  . Knee joint pain 09/23/11    needs replacements on both   Past Surgical History  Procedure Laterality Date  . Amputation  09/27/2011    Procedure: AMPUTATION DIGIT;  Surgeon: Wylene Simmer, MD;  Location: [facility];  Service: Orthopedics;  Laterality: Left;  Left second toe amputation   Family History  Problem Relation Age of Onset  . Diabetes Mother   . Hypertension Mother   . Diabetes Father   . Hypertension Father    History  Substance Use Topics  . Smoking status: Current Every Day Smoker  . Smokeless tobacco: Never Used  . Alcohol Use: 0.6 oz/week    1 Cans of beer per week     Comment: 1 to 2 beers daily    Review of Systems  All other systems reviewed and are negative.     Allergies  Review of patient's allergies indicates no known allergies.  Home Medications   Prior to Admission medications   Medication Sig Start Date End Date Taking? Authorizing Provider   ibuprofen (ADVIL,MOTRIN) 800 MG tablet Take 800 mg by mouth every 8 (eight) hours as needed for moderate pain.   Yes Historical Provider, MD  ketoconazole (NIZORAL) 2 % cream Apply 1 application topically 2 (two) times daily as needed. Apply to itchy areas on legs/arms    Yes Historical Provider, MD  miconazole (MICOTIN) 2 % powder Apply 1 application topically 2 (two) times daily as needed. Apply to feet between toes but not to ulcer.    Yes Historical Provider, MD  Multiple Vitamins-Minerals (MULTIVITAMINS THER. W/MINERALS) TABS Take 1 tablet by mouth daily.     Yes Historical Provider, MD  oxyCODONE (OXY IR/ROXICODONE) 5 MG immediate release tablet Take 5 mg by mouth every 4 (four) hours as needed for severe pain.   Yes Historical Provider, MD  amLODipine (NORVASC) 10 MG tablet Take 10 mg by mouth daily.      Historical Provider, MD  carvedilol (COREG) 6.25 MG tablet Take 6.25 mg by mouth 2 (two) times daily with a meal.      Historical Provider, MD  furosemide (LASIX) 20 MG tablet Take 20 mg by mouth daily.      Historical Provider, MD  spironolactone (ALDACTONE) 25 MG tablet Take 50 mg by mouth daily.      Historical Provider, MD   BP 136/96  Pulse  74  Temp(Src) 99.2 F (37.3 C) (Oral)  Resp 20  SpO2 100% Physical Exam  Nursing note and vitals reviewed. Constitutional: He is oriented to person, place, and time. He appears well-developed and well-nourished.  HENT:  Head: Normocephalic and atraumatic.  Eyes: Conjunctivae and EOM are normal. Pupils are equal, round, and reactive to light. Right eye exhibits no discharge. Left eye exhibits no discharge. No scleral icterus.  Neck: Normal range of motion. Neck supple. No JVD present.  Cardiovascular: Normal rate, regular rhythm and normal heart sounds.  Exam reveals no gallop and no friction rub.   No murmur heard. Pulmonary/Chest: Effort normal and breath sounds normal. No respiratory distress. He has no wheezes. He has no rales. He  exhibits no tenderness.  Abdominal: Soft. He exhibits no distension and no mass. There is no tenderness. There is no rebound and no guarding.  Musculoskeletal: Normal range of motion. He exhibits no edema and no tenderness.  Right upper extremity to palpation diffusely, no bony abnormality or deformity  Neurological: He is alert and oriented to person, place, and time.  Skin: Skin is warm and dry.  Psychiatric: He has a normal mood and affect. His behavior is normal. Judgment and thought content normal.    ED Course  Procedures (including critical care time) Labs Review Labs Reviewed - No data to display  Imaging Review Dg Elbow Complete Right  06/20/2014   CLINICAL DATA:  Wrist pain.  History of gout.  EXAM: RIGHT WRIST - COMPLETE 3+ VIEW; RIGHT ELBOW - COMPLETE 3+ VIEW  COMPARISON:  None.  FINDINGS: There are moderate degenerative changes involving the radial aspect of the wrist mainly at the articulation of the scaphoid with the trapezium and trapezoid. Suspect erosions. No chondrocalcinosis. No acute findings. Vascular calcifications are noted.  : Degenerative changes and possible erosions involving the radial aspect of the wrist.  No acute bony findings.   Electronically Signed   By: Kalman Jewels M.D.   On: 06/20/2014 18:43   Dg Wrist Complete Right  06/20/2014   CLINICAL DATA:  Wrist pain.  History of gout.  EXAM: RIGHT WRIST - COMPLETE 3+ VIEW; RIGHT ELBOW - COMPLETE 3+ VIEW  COMPARISON:  None.  FINDINGS: There are moderate degenerative changes involving the radial aspect of the wrist mainly at the articulation of the scaphoid with the trapezium and trapezoid. Suspect erosions. No chondrocalcinosis. No acute findings. Vascular calcifications are noted.  : Degenerative changes and possible erosions involving the radial aspect of the wrist.  No acute bony findings.   Electronically Signed   By: Kalman Jewels M.D.   On: 06/20/2014 18:43   Dg Hand Complete Right  06/20/2014   CLINICAL  DATA:  Known injury, history of gout, diffuse right hand pain  EXAM: RIGHT HAND - COMPLETE 3+ VIEW  COMPARISON:  None.  FINDINGS: Diffuse osteopenia. No fracture or periosteal reaction. No dislocation. Mild arthritis between the scaphoid and the trapezium and trapezoid bones. There is a somewhat erosive component to this. There is small vessel calcification along the volar surface of the wrist.  IMPRESSION: No acute traumatic injuries.   Electronically Signed   By: Skipper Cliche M.D.   On: 06/20/2014 18:43     EKG Interpretation None      MDM   Final diagnoses:  Acute gout of multiple sites, unspecified cause    Patient with right upper extremity pain. History of gout. States this feels similar. Some degenerative changes and possible erosions seen on plain  films. Will recommend primary care/hand surgery followup. Labs are reassuring. Potassium hemolyzed several times, but finally able to obtain nonhemolyzed specimen of 4.1.  Patient seen by and discussed with Dr. Aline Brochure.  Will discharge on prednisone as the patient cannot take NSAIDs or colchicine.  Patient understands and agrees with the plan.  He is stable and ready for discharge.  Dr. Aline Brochure agrees with treatment and workup plan.    Montine Circle, PA-C 06/20/14 2114

## 2014-06-20 NOTE — ED Notes (Addendum)
Per ems pt c/o of right arm pain x3 days, hx of gout. 10/10 pain. Ambulatory with assistance. Right arm is tender to the touch.   ems reported that pt walks at home with a cane. No cane present when pt arrived to ED.

## 2014-06-20 NOTE — Discharge Instructions (Signed)
Gout Gout is an inflammatory arthritis caused by a buildup of uric acid crystals in the joints. Uric acid is a chemical that is normally present in the blood. When the level of uric acid in the blood is too high it can form crystals that deposit in your joints and tissues. This causes joint redness, soreness, and swelling (inflammation). Repeat attacks are common. Over time, uric acid crystals can form into masses (tophi) near a joint, destroying bone and causing disfigurement. Gout is treatable and often preventable. CAUSES  The disease begins with elevated levels of uric acid in the blood. Uric acid is produced by your body when it breaks down a naturally found substance called purines. Certain foods you eat, such as meats and fish, contain high amounts of purines. Causes of an elevated uric acid level include:  Being passed down from parent to child (heredity).  Diseases that cause increased uric acid production (such as obesity, psoriasis, and certain cancers).  Excessive alcohol use.  Diet, especially diets rich in meat and seafood.  Medicines, including certain cancer-fighting medicines (chemotherapy), water pills (diuretics), and aspirin.  Chronic kidney disease. The kidneys are no longer able to remove uric acid well.  Problems with metabolism. Conditions strongly associated with gout include:  Obesity.  High blood pressure.  High cholesterol.  Diabetes. Not everyone with elevated uric acid levels gets gout. It is not understood why some people get gout and others do not. Surgery, joint injury, and eating too much of certain foods are some of the factors that can lead to gout attacks. SYMPTOMS   An attack of gout comes on quickly. It causes intense pain with redness, swelling, and warmth in a joint.  Fever can occur.  Often, only one joint is involved. Certain joints are more commonly involved:  Base of the big toe.  Knee.  Ankle.  Wrist.  Finger. Without  treatment, an attack usually goes away in a few days to weeks. Between attacks, you usually will not have symptoms, which is different from many other forms of arthritis. DIAGNOSIS  Your caregiver will suspect gout based on your symptoms and exam. In some cases, tests may be recommended. The tests may include:  Blood tests.  Urine tests.  X-rays.  Joint fluid exam. This exam requires a needle to remove fluid from the joint (arthrocentesis). Using a microscope, gout is confirmed when uric acid crystals are seen in the joint fluid. TREATMENT  There are two phases to gout treatment: treating the sudden onset (acute) attack and preventing attacks (prophylaxis).  Treatment of an Acute Attack.  Medicines are used. These include anti-inflammatory medicines or steroid medicines.  An injection of steroid medicine into the affected joint is sometimes necessary.  The painful joint is rested. Movement can worsen the arthritis.  You may use warm or cold treatments on painful joints, depending which works best for you.  Treatment to Prevent Attacks.  If you suffer from frequent gout attacks, your caregiver may advise preventive medicine. These medicines are started after the acute attack subsides. These medicines either help your kidneys eliminate uric acid from your body or decrease your uric acid production. You may need to stay on these medicines for a very long time.  The early phase of treatment with preventive medicine can be associated with an increase in acute gout attacks. For this reason, during the first few months of treatment, your caregiver may also advise you to take medicines usually used for acute gout treatment. Be sure you   understand your caregiver's directions. Your caregiver may make several adjustments to your medicine dose before these medicines are effective.  Discuss dietary treatment with your caregiver or dietitian. Alcohol and drinks high in sugar and fructose and foods  such as meat, poultry, and seafood can increase uric acid levels. Your caregiver or dietitian can advise you on drinks and foods that should be limited. HOME CARE INSTRUCTIONS   Do not take aspirin to relieve pain. This raises uric acid levels.  Only take over-the-counter or prescription medicines for pain, discomfort, or fever as directed by your caregiver.  Rest the joint as much as possible. When in bed, keep sheets and blankets off painful areas.  Keep the affected joint raised (elevated).  Apply warm or cold treatments to painful joints. Use of warm or cold treatments depends on which works best for you.  Use crutches if the painful joint is in your leg.  Drink enough fluids to keep your urine clear or pale yellow. This helps your body get rid of uric acid. Limit alcohol, sugary drinks, and fructose drinks.  Follow your dietary instructions. Pay careful attention to the amount of protein you eat. Your daily diet should emphasize fruits, vegetables, whole grains, and fat-free or low-fat milk products. Discuss the use of coffee, vitamin C, and cherries with your caregiver or dietitian. These may be helpful in lowering uric acid levels.  Maintain a healthy body weight. SEEK MEDICAL CARE IF:   You develop diarrhea, vomiting, or any side effects from medicines.  You do not feel better in 24 hours, or you are getting worse. SEEK IMMEDIATE MEDICAL CARE IF:   Your joint becomes suddenly more tender, and you have chills or a fever. MAKE SURE YOU:   Understand these instructions.  Will watch your condition.  Will get help right away if you are not doing well or get worse. Document Released: 10/28/2000 Document Revised: 03/17/2014 Document Reviewed: 06/13/2012 [employer] Patient Information 2015 [employer]. This information is not intended to replace advice given to you by your health care provider. Make sure you discuss any questions you have with your health care  provider. Low-Purine Diet Purines are compounds that affect the level of uric acid in your body. A low-purine diet is a diet that is low in purines. Eating a low-purine diet can prevent the level of uric acid in your body from getting too high and causing gout or kidney stones or both. WHAT DO I NEED TO KNOW ABOUT THIS DIET?  Choose low-purine foods. Examples of low-purine foods are listed in the next section.  Drink plenty of fluids, especially water. Fluids can help remove uric acid from your body. Try to drink 8-16 cups (1.9-3.8 L) a day.  Limit foods high in fat, especially saturated fat, as fat makes it harder for the body to get rid of uric acid. Foods high in saturated fat include pizza, cheese, ice cream, whole milk, fried foods, and gravies. Choose foods that are lower in fat and lean sources of protein. Use olive oil when cooking as it contains healthy fats that are not high in saturated fat.  Limit alcohol. Alcohol interferes with the elimination of uric acid from your body. If you are having a gout attack, avoid all alcohol.  Keep in mind that different people's bodies react differently to different foods. You will probably learn over time which foods do or do not affect you. If you discover that a food tends to cause your gout to flare up,   avoid eating that food. You can more freely enjoy foods that do not cause problems. If you have any questions about a food item, talk to your dietitian or health care provider. WHICH FOODS ARE LOW, MODERATE, AND HIGH IN PURINES? The following is a list of foods that are low, moderate, and high in purines. You can eat any amount of the foods that are low in purines. You may be able to have small amounts of foods that are moderate in purines. Ask your health care provider how much of a food moderate in purines you can have. Avoid foods high in purines. Grains  Foods low in purines: Enriched white bread, pasta, rice, cake, cornbread, popcorn.  Foods  moderate in purines: Whole-grain breads and cereals, wheat germ, bran, oatmeal. Uncooked oatmeal. Dry wheat bran or wheat germ.  Foods high in purines: Pancakes, French toast, biscuits, muffins. Vegetables  Foods low in purines: All vegetables, except those that are moderate in purines.  Foods moderate in purines: Asparagus, cauliflower, spinach, mushrooms, green peas. Fruits  All fruits are low in purines. Meats and other Protein Foods  Foods low in purines: Eggs, nuts, peanut butter.  Foods moderate in purines: 80-90% lean beef, lamb, veal, pork, poultry, fish, eggs, peanut butter, nuts. Crab, lobster, oysters, and shrimp. Cooked dried beans, peas, and lentils.  Foods high in purines: Anchovies, sardines, herring, mussels, tuna, codfish, scallops, trout, and haddock. Bacon. Organ meats (such as liver or kidney). Tripe. Game meat. Goose. Sweetbreads. Dairy  All dairy foods are low in purines. Low-fat and fat-free dairy products are best because they are low in saturated fat. Beverages  Drinks low in purines: Water, carbonated beverages, tea, coffee, cocoa.  Drinks moderate in purines: Soft drinks and other drinks sweetened with high-fructose corn syrup. Juices. To find whether a food or drink is sweetened with high-fructose corn syrup, look at the ingredients list.  Drinks high in purines: Alcoholic beverages (such as beer). Condiments  Foods low in purines: Salt, herbs, olives, pickles, relishes, vinegar.  Foods moderate in purines: Butter, margarine, oils, mayonnaise. Fats and Oils  Foods low in purines: All types, except gravies and sauces made with meat.  Foods high in purines: Gravies and sauces made with meat. Other Foods  Foods low in purines: Sugars, sweets, gelatin. Cake. Soups made without meat.  Foods moderate in purines: Meat-based or fish-based soups, broths, or bouillons. Foods and drinks sweetened with high-fructose corn syrup.  Foods high in purines:  High-fat desserts (such as ice cream, cookies, cakes, pies, doughnuts, and chocolate). Contact your dietitian for more information on foods that are not listed here. Document Released: 02/25/2011 Document Revised: 11/05/2013 Document Reviewed: 10/07/2013 [employer] Patient Information 2015 [employer]. This information is not intended to replace advice given to you by your health care provider. Make sure you discuss any questions you have with your health care provider.  

## 2014-06-20 NOTE — ED Notes (Signed)
PA at bedside.

## 2014-06-21 NOTE — ED Provider Notes (Signed)
Medical screening examination/treatment/procedure(s) were conducted as a shared visit with non-physician practitioner(s) and myself.  I personally evaluated the patient during the encounter.   EKG Interpretation None       I interviewed and examined the patient. Lungs are CTAB. Cardiac exam wnl. Abdomen soft. Mild swelling and ttp of right wrist and elbow. Pt w/ hx of gout and notes sx c/w previous gout episodes. Will treat symptomatically. Do not think this is a septic joint. Will have him return for any worsening including fever.   Pamella Pert, MD 06/21/14 1137

## 2014-06-30 LAB — COMPREHENSIVE METABOLIC PANEL

## 2014-08-08 ENCOUNTER — Encounter (HOSPITAL_COMMUNITY): Payer: Self-pay | Admitting: Emergency Medicine

## 2014-08-08 ENCOUNTER — Emergency Department (HOSPITAL_COMMUNITY)
Admission: EM | Admit: 2014-08-08 | Discharge: 2014-08-09 | Disposition: A | Discharge status: Home / Self Care | Payer: Non-veteran care | Attending: Emergency Medicine | Admitting: Emergency Medicine

## 2014-08-08 DIAGNOSIS — Z8505 Personal history of malignant neoplasm of liver: Secondary | ICD-10-CM | POA: Insufficient documentation

## 2014-08-08 DIAGNOSIS — R229 Localized swelling, mass and lump, unspecified: Secondary | ICD-10-CM | POA: Diagnosis not present

## 2014-08-08 DIAGNOSIS — R1084 Generalized abdominal pain: Secondary | ICD-10-CM | POA: Diagnosis not present

## 2014-08-08 DIAGNOSIS — R142 Eructation: Secondary | ICD-10-CM

## 2014-08-08 DIAGNOSIS — R0602 Shortness of breath: Secondary | ICD-10-CM | POA: Diagnosis not present

## 2014-08-08 DIAGNOSIS — Z8619 Personal history of other infectious and parasitic diseases: Secondary | ICD-10-CM | POA: Insufficient documentation

## 2014-08-08 DIAGNOSIS — F17200 Nicotine dependence, unspecified, uncomplicated: Secondary | ICD-10-CM | POA: Insufficient documentation

## 2014-08-08 DIAGNOSIS — Z79899 Other long term (current) drug therapy: Secondary | ICD-10-CM | POA: Diagnosis not present

## 2014-08-08 DIAGNOSIS — Z87448 Personal history of other diseases of urinary system: Secondary | ICD-10-CM | POA: Diagnosis not present

## 2014-08-08 DIAGNOSIS — I10 Essential (primary) hypertension: Secondary | ICD-10-CM | POA: Diagnosis not present

## 2014-08-08 DIAGNOSIS — M25519 Pain in unspecified shoulder: Secondary | ICD-10-CM | POA: Diagnosis not present

## 2014-08-08 DIAGNOSIS — R141 Gas pain: Secondary | ICD-10-CM | POA: Insufficient documentation

## 2014-08-08 DIAGNOSIS — M25512 Pain in left shoulder: Secondary | ICD-10-CM

## 2014-08-08 DIAGNOSIS — R143 Flatulence: Secondary | ICD-10-CM

## 2014-08-08 DIAGNOSIS — Z8719 Personal history of other diseases of the digestive system: Secondary | ICD-10-CM | POA: Diagnosis not present

## 2014-08-08 DIAGNOSIS — M549 Dorsalgia, unspecified: Secondary | ICD-10-CM | POA: Insufficient documentation

## 2014-08-08 MED ORDER — OXYCODONE-ACETAMINOPHEN 5-325 MG PO TABS: 2.0000 | ORAL_TABLET | Freq: Once | ORAL | Status: DC

## 2014-08-08 MED ORDER — HYDROMORPHONE HCL 2 MG/ML IJ SOLN
2.0000 mg | Freq: Once | INTRAMUSCULAR | Status: AC
  Administered 2014-08-09: 2 mg via INTRAMUSCULAR
  Filled 2014-08-08: qty 1

## 2014-08-08 MED ORDER — METHOCARBAMOL 500 MG PO TABS
1000.0000 mg | ORAL_TABLET | Freq: Once | ORAL | Status: AC
  Administered 2014-08-09: 1000 mg via ORAL
  Filled 2014-08-08: qty 2

## 2014-08-08 MED ORDER — LIDOCAINE 5 % EX PTCH
1.0000 | MEDICATED_PATCH | CUTANEOUS | Status: DC
  Administered 2014-08-09: 1 via TRANSDERMAL
  Filled 2014-08-08: qty 1

## 2014-08-08 NOTE — ED Notes (Addendum)
FYI: while assisting pt with undressing I (emt Lezlie Octave) sat pt's walking cane on the  table and accidentally knocked pt's reading glasses to the floor. The arm which is part of the glasses frame came off. Pt stated not to worry abt it because the glasses were already missing the left arm and he is planning to get new glasses on Monday.  I will notify charge nurse of situation.

## 2014-08-08 NOTE — ED Notes (Addendum)
Pt presents with pain to swollen area to trapezius area, pt states pain radiates to shoulder and up neck. Pt has had fatty tumor drained in the past. Pt states pain worse today, pt states he is has appt Monday at [facility]  Pt states he has been out of his oxycontin x 3 days.

## 2014-08-09 MED ORDER — OXYCODONE HCL 5 MG PO TABS: 10.0000 mg | ORAL_TABLET | ORAL | Status: DC | PRN

## 2014-08-09 MED ORDER — LIDOCAINE 5 % EX PTCH: 1.0000 | MEDICATED_PATCH | CUTANEOUS | Status: DC

## 2014-08-09 MED ORDER — METHOCARBAMOL 750 MG PO TABS: 750.0000 mg | ORAL_TABLET | Freq: Four times a day (QID) | ORAL | Status: DC

## 2014-08-09 NOTE — ED Provider Notes (Signed)
CSN: [id number]     Arrival date & time 08/08/14  2030 History   First MD Initiated Contact with Patient 08/08/14 2310     Chief Complaint  Patient presents with  . Back Pain     (Consider location/radiation/quality/duration/timing/severity/associated sxs/prior Treatment) HPI 63 yo male presents to the ER from home with complaint of left shoulder pain.  Pt reports he has had a lump on his left shoulder for the last 6-8 months.  Over the last 10 days he has had pain in his left shoulder into his neck and rotator cuff.  He reports similar problems in his right shoulder in the remote past, reports a doctor at the [place] drew out a lot of green pus and sxs resolved.  No fevers, redness, or limitation in movement.  Pt reports he has "chronic liver cancer" and was given 12 weeks to live about 2-3 months ago.  He is not undergoing tx from oncology.  He sees a hospice pain doctor, and is due to f/u on Monday.  Pt reports the oxycodone helps with his liver pain, but he has had to increase his dosing due to shoulder pain.  He reports he has stopped taking all other medications.  He reports some worsening abdominal pain, distension, and shortness of breath over the last month as well, but not severe.  Pt c/o clear then dark urine recently as well. Past Medical History  Diagnosis Date  . Hypertension   . Gout   . Hepatitis C   . Liver disorder     found 2 spots  4 years ago, last report "pretty good"  . Kidney disease     1 spot on kidney, stable at last check 9 months ago  . Knee joint pain 09/23/11    needs replacements on both   Past Surgical History  Procedure Laterality Date  . Amputation  09/27/2011    Procedure: AMPUTATION DIGIT;  Surgeon: Wylene Simmer, MD;  Location: [facility];  Service: Orthopedics;  Laterality: Left;  Left second toe amputation   Family History  Problem Relation Age of Onset  . Diabetes Mother   . Hypertension Mother   . Diabetes Father   . Hypertension Father    History   Substance Use Topics  . Smoking status: Current Every Day Smoker  . Smokeless tobacco: Never Used  . Alcohol Use: 0.6 oz/week    1 Cans of beer per week     Comment: 1 to 2 beers daily    Review of Systems  All other systems reviewed and are negative. other than listed in hpi    Allergies  Review of patient's allergies indicates no known allergies.  Home Medications   Prior to Admission medications   Medication Sig Start Date End Date Taking? Authorizing Provider  amLODipine (NORVASC) 10 MG tablet Take 10 mg by mouth daily.     Yes Historical Provider, MD  carvedilol (COREG) 6.25 MG tablet Take 6.25 mg by mouth 2 (two) times daily with a meal.     Yes Historical Provider, MD  furosemide (LASIX) 20 MG tablet Take 20 mg by mouth daily.     Yes Historical Provider, MD  spironolactone (ALDACTONE) 25 MG tablet Take 50 mg by mouth daily.     Yes Historical Provider, MD  oxyCODONE (OXY IR/ROXICODONE) 5 MG immediate release tablet Take 5 mg by mouth every 4 (four) hours as needed for severe pain.    Historical Provider, MD   BP 127/89  Pulse  73  Temp(Src) 99.3 F (37.4 C) (Oral)  Resp 16  Ht 6' (1.829 m)  Wt 245 lb (111.131 kg)  BMI 33.22 kg/m2  SpO2 97% Physical Exam  Nursing note and vitals reviewed. Constitutional: He is oriented to person, place, and time. He appears well-developed and well-nourished. No distress.  HENT:  Head: Normocephalic and atraumatic.  Nose: Nose normal.  Mouth/Throat: Oropharynx is clear and moist.  Eyes: Conjunctivae and EOM are normal. Pupils are equal, round, and reactive to light. No scleral icterus.  Neck: Normal range of motion. Neck supple. No JVD present. No tracheal deviation present. No thyromegaly present.  Cardiovascular: Normal rate, regular rhythm, normal heart sounds and intact distal pulses.  Exam reveals no gallop and no friction rub.   No murmur heard. Pulmonary/Chest: Effort normal and breath sounds normal. No stridor. No  respiratory distress. He has no wheezes. He has no rales. He exhibits no tenderness.  No dyspnea  Abdominal: Soft. Bowel sounds are normal. He exhibits distension. He exhibits no mass. There is tenderness (mild diffuse). There is no rebound and no guarding.  Mild distention, slight fluid wave, not tight.  Musculoskeletal: Normal range of motion. He exhibits no edema and no tenderness.  Pt with 4 cm soft mobile mass just superior to left scapula.  TTP from left trapezius/deltoid to rotator cuff   Lymphadenopathy:    He has no cervical adenopathy.  Neurological: He is alert and oriented to person, place, and time. He displays normal reflexes. He exhibits normal muscle tone. Coordination normal.  Skin: Skin is warm and dry. No rash noted. No erythema. No pallor.  Psychiatric: He has a normal mood and affect. His behavior is normal. Judgment and thought content normal.    ED Course  Procedures (including critical care time) Labs Review Labs Reviewed  URINALYSIS, ROUTINE W REFLEX MICROSCOPIC    Imaging Review No results found.   EKG Interpretation None      MDM   Final diagnoses:  Shoulder pain, left    63 yo male with left shoulder pain, chronic (terminal?) liver cancer.  Will treat acute pain here.  Pt not wishing treatment for his cancer, will not get labs at this time.  Will have pt f/u with his doctor as scheduled on Monday.  May need fentanyl patch for pain control, routine paracentesis, but no indication for either of these urgently at this time.    Kalman Drape, MD 08/09/14 [phone]

## 2014-08-09 NOTE — Discharge Instructions (Signed)
Please follow up on Monday with your hospice doctor.  Talk with them about having a Fentanyl patch to help with pain control.  You may need a paracentesis to help with your shortness of breath and abdominal swelling.  Please see your orthopedist about your shoulder pain.   Heat Therapy Heat therapy can help ease sore, stiff, injured, and tight muscles and joints. Heat relaxes your muscles, which may help ease your pain.  RISKS AND COMPLICATIONS If you have any of the following conditions, do not use heat therapy unless your health care provider has approved:  Poor circulation.  Healing wounds or scarred skin in the area being treated.  Diabetes, heart disease, or high blood pressure.  Not being able to feel (numbness) the area being treated.  Unusual swelling of the area being treated.  Active infections.  Blood clots.  Cancer.  Inability to communicate pain. This may include young children and people who have problems with their brain function (dementia).  Pregnancy. Heat therapy should only be used on old, pre-existing, or long-lasting (chronic) injuries. Do not use heat therapy on new injuries unless directed by your health care provider. HOW TO USE HEAT THERAPY There are several different kinds of heat therapy, including:  Moist heat pack.  Warm water bath.  Hot water bottle.  Electric heating pad.  Heated gel pack.  Heated wrap.  Electric heating pad. Use the heat therapy method suggested by your health care provider. Follow your health care provider's instructions on when and how to use heat therapy. GENERAL HEAT THERAPY RECOMMENDATIONS  Do not sleep while using heat therapy. Only use heat therapy while you are awake.  Your skin may turn pink while using heat therapy. Do not use heat therapy if your skin turns red.  Do not use heat therapy if you have new pain.  High heat or long exposure to heat can cause burns. Be careful when using heat therapy to avoid  burning your skin.  Do not use heat therapy on areas of your skin that are already irritated, such as with a rash or sunburn. SEEK MEDICAL CARE IF:  You have blisters, redness, swelling, or numbness.  You have new pain.  Your pain is worse. MAKE SURE YOU:  Understand these instructions.  Will watch your condition.  Will get help right away if you are not doing well or get worse. Document Released: 01/23/2012 Document Revised: 03/17/2014 Document Reviewed: 12/24/2013 [facility] Patient Information 2015 [place], [place]. This information is not intended to replace advice given to you by your health care provider. Make sure you discuss any questions you have with your health care provider.

## 2014-08-09 NOTE — ED Notes (Signed)
Patient is alert and oriented x3.  He was given DC instructions and follow up visit instructions.  Patient gave verbal understanding.  He was DC ambulatory under his own power to home.  V/S stable.  He was not showing any signs of distress on DC 

## 2014-12-05 ENCOUNTER — Inpatient Hospital Stay (HOSPITAL_COMMUNITY)
Admission: EM | Admit: 2014-12-05 | Discharge: 2014-12-08 | DRG: 603 | Disposition: A | Discharge status: Home / Self Care | Payer: Non-veteran care | Attending: Internal Medicine | Admitting: Internal Medicine

## 2014-12-05 ENCOUNTER — Inpatient Hospital Stay (HOSPITAL_COMMUNITY): Payer: Non-veteran care

## 2014-12-05 ENCOUNTER — Encounter (HOSPITAL_COMMUNITY): Payer: Self-pay

## 2014-12-05 DIAGNOSIS — M7989 Other specified soft tissue disorders: Secondary | ICD-10-CM

## 2014-12-05 DIAGNOSIS — J069 Acute upper respiratory infection, unspecified: Secondary | ICD-10-CM | POA: Diagnosis present

## 2014-12-05 DIAGNOSIS — R059 Cough, unspecified: Secondary | ICD-10-CM

## 2014-12-05 DIAGNOSIS — Z89422 Acquired absence of other left toe(s): Secondary | ICD-10-CM | POA: Diagnosis not present

## 2014-12-05 DIAGNOSIS — Z79891 Long term (current) use of opiate analgesic: Secondary | ICD-10-CM | POA: Diagnosis not present

## 2014-12-05 DIAGNOSIS — M10372 Gout due to renal impairment, left ankle and foot: Secondary | ICD-10-CM | POA: Insufficient documentation

## 2014-12-05 DIAGNOSIS — L03116 Cellulitis of left lower limb: Principal | ICD-10-CM | POA: Diagnosis present

## 2014-12-05 DIAGNOSIS — C7900 Secondary malignant neoplasm of unspecified kidney and renal pelvis: Secondary | ICD-10-CM | POA: Diagnosis present

## 2014-12-05 DIAGNOSIS — R062 Wheezing: Secondary | ICD-10-CM

## 2014-12-05 DIAGNOSIS — M109 Gout, unspecified: Secondary | ICD-10-CM

## 2014-12-05 DIAGNOSIS — Z8249 Family history of ischemic heart disease and other diseases of the circulatory system: Secondary | ICD-10-CM

## 2014-12-05 DIAGNOSIS — I10 Essential (primary) hypertension: Secondary | ICD-10-CM | POA: Diagnosis present

## 2014-12-05 DIAGNOSIS — N179 Acute kidney failure, unspecified: Secondary | ICD-10-CM | POA: Diagnosis present

## 2014-12-05 DIAGNOSIS — J988 Other specified respiratory disorders: Secondary | ICD-10-CM

## 2014-12-05 DIAGNOSIS — F17210 Nicotine dependence, cigarettes, uncomplicated: Secondary | ICD-10-CM | POA: Diagnosis present

## 2014-12-05 DIAGNOSIS — Z66 Do not resuscitate: Secondary | ICD-10-CM | POA: Diagnosis present

## 2014-12-05 DIAGNOSIS — R05 Cough: Secondary | ICD-10-CM

## 2014-12-05 DIAGNOSIS — L03119 Cellulitis of unspecified part of limb: Secondary | ICD-10-CM

## 2014-12-05 DIAGNOSIS — M79672 Pain in left foot: Secondary | ICD-10-CM

## 2014-12-05 DIAGNOSIS — C220 Liver cell carcinoma: Secondary | ICD-10-CM | POA: Diagnosis present

## 2014-12-05 DIAGNOSIS — L539 Erythematous condition, unspecified: Secondary | ICD-10-CM

## 2014-12-05 DIAGNOSIS — B1920 Unspecified viral hepatitis C without hepatic coma: Secondary | ICD-10-CM | POA: Diagnosis present

## 2014-12-05 DIAGNOSIS — F1010 Alcohol abuse, uncomplicated: Secondary | ICD-10-CM | POA: Diagnosis present

## 2014-12-05 HISTORY — DX: Liver cell carcinoma: C22.0

## 2014-12-05 LAB — CBC WITH DIFFERENTIAL/PLATELET
BASOS PCT: 0 % (ref 0–1)
Basophils Absolute: 0 10*3/uL (ref 0.0–0.1)
EOS PCT: 1 % (ref 0–5)
Eosinophils Absolute: 0.1 10*3/uL (ref 0.0–0.7)
HCT: 32.9 % — ABNORMAL LOW (ref 39.0–52.0)
HEMOGLOBIN: 11.3 g/dL — AB (ref 13.0–17.0)
LYMPHS PCT: 24 % (ref 12–46)
Lymphs Abs: 2.5 10*3/uL (ref 0.7–4.0)
MCH: 33.3 pg (ref 26.0–34.0)
MCHC: 34.3 g/dL (ref 30.0–36.0)
MCV: 97.1 fL (ref 78.0–100.0)
MONOS PCT: 12 % (ref 3–12)
Monocytes Absolute: 1.3 10*3/uL — ABNORMAL HIGH (ref 0.1–1.0)
NEUTROS ABS: 6.6 10*3/uL (ref 1.7–7.7)
Neutrophils Relative %: 63 % (ref 43–77)
Platelets: 138 10*3/uL — ABNORMAL LOW (ref 150–400)
RBC: 3.39 MIL/uL — ABNORMAL LOW (ref 4.22–5.81)
RDW: 15.5 % (ref 11.5–15.5)
WBC: 10.5 10*3/uL (ref 4.0–10.5)

## 2014-12-05 LAB — BASIC METABOLIC PANEL
Anion gap: 5 (ref 5–15)
BUN: 16 mg/dL (ref 6–23)
CO2: 30 mmol/L (ref 19–32)
CREATININE: 1.49 mg/dL — AB (ref 0.50–1.35)
Calcium: 7.9 mg/dL — ABNORMAL LOW (ref 8.4–10.5)
Chloride: 96 mEq/L (ref 96–112)
GFR calc non Af Amer: 49 mL/min — ABNORMAL LOW (ref >90)
GFR, EST AFRICAN AMERICAN: 56 mL/min — AB (ref >90)
Glucose, Bld: 110 mg/dL — ABNORMAL HIGH (ref 70–99)
POTASSIUM: 3.6 mmol/L (ref 3.5–5.1)
SODIUM: 131 mmol/L — AB (ref 135–145)

## 2014-12-05 LAB — COMPREHENSIVE METABOLIC PANEL
ALBUMIN: 2 g/dL — AB (ref 3.5–5.2)
ALK PHOS: 239 U/L — AB (ref 39–117)
ALT: 31 U/L (ref 0–53)
AST: 122 U/L — ABNORMAL HIGH (ref 0–37)
Anion gap: 2 — ABNORMAL LOW (ref 5–15)
BILIRUBIN TOTAL: 2.3 mg/dL — AB (ref 0.3–1.2)
BUN: 15 mg/dL (ref 6–23)
CALCIUM: 8 mg/dL — AB (ref 8.4–10.5)
CHLORIDE: 99 mmol/L (ref 96–112)
CO2: 32 mmol/L (ref 19–32)
CREATININE: 1.33 mg/dL (ref 0.50–1.35)
GFR calc Af Amer: 65 mL/min — ABNORMAL LOW (ref >90)
GFR, EST NON AFRICAN AMERICAN: 56 mL/min — AB (ref >90)
GLUCOSE: 153 mg/dL — AB (ref 70–99)
Potassium: 4.7 mmol/L (ref 3.5–5.1)
Sodium: 133 mmol/L — ABNORMAL LOW (ref 135–145)
Total Protein: 6.9 g/dL (ref 6.0–8.3)

## 2014-12-05 LAB — PROTIME-INR
INR: 1.26 (ref 0.00–1.49)
Prothrombin Time: 15.9 seconds — ABNORMAL HIGH (ref 11.6–15.2)

## 2014-12-05 LAB — MAGNESIUM: Magnesium: 1.9 mg/dL (ref 1.5–2.5)

## 2014-12-05 LAB — AMMONIA: Ammonia: 27 umol/L (ref 11–32)

## 2014-12-05 MED ORDER — COLCHICINE 0.6 MG PO TABS
0.6000 mg | ORAL_TABLET | Freq: Two times a day (BID) | ORAL | Status: DC
  Administered 2014-12-05: 0.6 mg via ORAL
  Filled 2014-12-05: qty 1

## 2014-12-05 MED ORDER — FUROSEMIDE 20 MG PO TABS
20.0000 mg | ORAL_TABLET | Freq: Every day | ORAL | Status: DC
  Administered 2014-12-06 – 2014-12-08 (×3): 20 mg via ORAL
  Filled 2014-12-05 (×3): qty 1

## 2014-12-05 MED ORDER — CARVEDILOL 6.25 MG PO TABS
6.2500 mg | ORAL_TABLET | Freq: Two times a day (BID) | ORAL | Status: DC
  Administered 2014-12-05 – 2014-12-08 (×6): 6.25 mg via ORAL
  Filled 2014-12-05 (×7): qty 1

## 2014-12-05 MED ORDER — CEFAZOLIN SODIUM-DEXTROSE 2-3 GM-% IV SOLR
2.0000 g | Freq: Three times a day (TID) | INTRAVENOUS | Status: DC
  Administered 2014-12-05 – 2014-12-08 (×9): 2 g via INTRAVENOUS
  Filled 2014-12-05 (×11): qty 50

## 2014-12-05 MED ORDER — METHYLPREDNISOLONE SODIUM SUCC 125 MG IJ SOLR
125.0000 mg | Freq: Once | INTRAMUSCULAR | Status: AC
  Administered 2014-12-05: 125 mg via INTRAVENOUS
  Filled 2014-12-05: qty 2

## 2014-12-05 MED ORDER — FUROSEMIDE 40 MG PO TABS
40.0000 mg | ORAL_TABLET | Freq: Once | ORAL | Status: AC
  Administered 2014-12-05: 40 mg via ORAL
  Filled 2014-12-05: qty 1

## 2014-12-05 MED ORDER — OXYCODONE HCL 5 MG PO TABS
5.0000 mg | ORAL_TABLET | ORAL | Status: DC | PRN
Start: 2014-12-05 — End: 2014-12-08
  Administered 2014-12-05 – 2014-12-08 (×9): 5 mg via ORAL
  Filled 2014-12-05 (×9): qty 1

## 2014-12-05 MED ORDER — IPRATROPIUM-ALBUTEROL 0.5-2.5 (3) MG/3ML IN SOLN
3.0000 mL | RESPIRATORY_TRACT | Status: DC | PRN
  Administered 2014-12-05 – 2014-12-08 (×3): 3 mL via RESPIRATORY_TRACT
  Filled 2014-12-05 (×3): qty 3

## 2014-12-05 MED ORDER — HYDROMORPHONE HCL 1 MG/ML IJ SOLN
1.0000 mg | Freq: Once | INTRAMUSCULAR | Status: AC
  Administered 2014-12-05: 1 mg via INTRAVENOUS
  Filled 2014-12-05: qty 1

## 2014-12-05 MED ORDER — CEFAZOLIN SODIUM 1-5 GM-% IV SOLN
1.0000 g | Freq: Three times a day (TID) | INTRAVENOUS | Status: DC
  Filled 2014-12-05 (×2): qty 50

## 2014-12-05 MED ORDER — FUROSEMIDE 20 MG PO TABS: 20.0000 mg | ORAL_TABLET | Freq: Every day | ORAL | Status: DC

## 2014-12-05 MED ORDER — SPIRONOLACTONE 25 MG PO TABS
50.0000 mg | ORAL_TABLET | Freq: Every day | ORAL | Status: DC
  Administered 2014-12-05 – 2014-12-08 (×4): 50 mg via ORAL
  Filled 2014-12-05 (×4): qty 2

## 2014-12-05 NOTE — ED Notes (Signed)
Attempted report 

## 2014-12-05 NOTE — ED Provider Notes (Signed)
CSN: [id number]     Arrival date & time 12/05/14  0631 History   First MD Initiated Contact with Patient 12/05/14 0654     Chief Complaint  Patient presents with  . Foot Pain     Patient is a 64 y.o. male presenting with lower extremity pain. The history is provided by the patient. No language interpreter was used.  Foot Pain   Bryan Reyes presents for evaluation of left foot pain.  He has had swelling and pain in his left foot for the last four days.  His sxs feel like prior gout episodes.  He denies fevers.  Pt has hx/o liver cancer that spread to his stomach and states that he has been told that he does not want to live. He is not currently in hospice. He lives alone in a hotel room. He is unable to bear weight the left foot.  Past Medical History  Diagnosis Date  . Hypertension   . Gout   . Hepatitis C   . Liver disorder     found 2 spots  4 years ago, last report "pretty good"  . Reyes disease     1 spot on Reyes, stable at last check 9 months ago  . Knee joint pain 09/23/11    needs replacements on both   Past Surgical History  Procedure Laterality Date  . Amputation  09/27/2011    Procedure: AMPUTATION DIGIT;  Surgeon: Wylene Simmer, MD;  Location: [facility];  Service: Orthopedics;  Laterality: Left;  Left second toe amputation   Family History  Problem Relation Age of Onset  . Diabetes Mother   . Hypertension Mother   . Diabetes Father   . Hypertension Father    History  Substance Use Topics  . Smoking status: Current Every Day Smoker  . Smokeless tobacco: Never Used  . Alcohol Use: 0.6 oz/week    1 Cans of beer per week     Comment: 1 to 2 beers daily    Review of Systems  All other systems reviewed and are negative.     Allergies  Review of patient's allergies indicates no known allergies.  Home Medications   Prior to Admission medications   Medication Sig Start Date End Date Taking? Authorizing Provider  amLODipine (NORVASC) 10 MG tablet Take 10 mg by  mouth daily.      Historical Provider, MD  carvedilol (COREG) 6.25 MG tablet Take 6.25 mg by mouth 2 (two) times daily with a meal.      Historical Provider, MD  furosemide (LASIX) 20 MG tablet Take 20 mg by mouth daily.      Historical Provider, MD  lidocaine (LIDODERM) 5 % Place 1 patch onto the skin daily. Remove & Discard patch within 12 hours or as directed by MD 08/09/14   Kalman Drape, MD  methocarbamol (ROBAXIN-750) 750 MG tablet Take 1 tablet (750 mg total) by mouth 4 (four) times daily. 08/09/14   Kalman Drape, MD  oxyCODONE (OXY IR/ROXICODONE) 5 MG immediate release tablet Take 2 tablets (10 mg total) by mouth every 4 (four) hours as needed for severe pain. 08/09/14   Kalman Drape, MD  spironolactone (ALDACTONE) 25 MG tablet Take 50 mg by mouth daily.      Historical Provider, MD   BP 147/83 mmHg  Pulse 76  Temp(Src) 98.7 F (37.1 C) (Oral)  Resp 22  SpO2 98% Physical Exam  Constitutional: He is oriented to person, place, and time.  Chronically  ill appearing  HENT:  Head: Normocephalic and atraumatic.  Cardiovascular: Normal rate and regular rhythm.   Pulmonary/Chest:  Occasional rhonchi bilaterally  Abdominal:  Mildly distended, nontender  Musculoskeletal:  2+ pitting edema of RLE with chronic venous stasis changes, nontender.  3+ pitting edema of LLE with erythema and marked tenderness over dorsal foot.    Neurological: He is alert and oriented to person, place, and time.  Skin: Skin is warm and dry.  Psychiatric: He has a normal mood and affect. His behavior is normal.  Nursing note and vitals reviewed.   ED Course  Procedures (including critical care time) Labs Review Labs Reviewed  CBC WITH DIFFERENTIAL - Abnormal; Notable for the following:    RBC 3.39 (*)    Hemoglobin 11.3 (*)    HCT 32.9 (*)    Platelets 138 (*)    Monocytes Absolute 1.3 (*)    All other components within normal limits  BASIC METABOLIC PANEL - Abnormal; Notable for the following:    Sodium  131 (*)    Glucose, Bld 110 (*)    Creatinine, Ser 1.49 (*)    Calcium 7.9 (*)    GFR calc non Af Amer 49 (*)    GFR calc Af Amer 56 (*)    All other components within normal limits    Imaging Review No results found.   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 There is no evidence of acute infectious process at this time. Patient is unable to ambulate or care for himself due to pain. Attempted to contact patient's sister and her power of attorney, Bryan Reyes, but there is no answer on the phone and the voice message system is full. Discussed with social work and there is no ability to get home health establish for today given the current weather. Discussed with medicine regarding observation admit for gout flare pending home health or home hospice services are available to the patient.    Quintella Reichert, MD 12/05/14 1339

## 2014-12-05 NOTE — Progress Notes (Signed)
PT Cancellation Note  Patient Details Name: Bryan Reyes MRN: [id number] DOB: [birth year]   Cancelled Treatment:    Reason Eval/Treat Not Completed: Patient's level of consciousness Pt very drowsy and sleepy since admission and having difficulty staying awake. Will follow up next available time.   Candy Sledge A 12/05/2014, 2:49 PM Candy Sledge, [place], DPT [phone]

## 2014-12-05 NOTE — ED Notes (Signed)
Per EMS: Pt reports left foot pain x 4 days with increased swelling. Denies injury. Hx of gout, states this feels the same.

## 2014-12-05 NOTE — Progress Notes (Signed)
ANTIBIOTIC CONSULT NOTE - INITIAL  Pharmacy Consult for Cefazolin Indication: Cellulitis  No Known Allergies  Patient Measurements: Height: 6' (182.9 cm) Weight: 238 lb 8 oz (108.183 kg) IBW/kg (Calculated) : 77.6  Vital Signs: Temp: 98.4 F (36.9 C) (01/22 1659) Temp Source: Oral (01/22 1659) BP: 145/65 mmHg (01/22 1659) Pulse Rate: 67 (01/22 1659) Intake/Output from previous day:   Intake/Output from this shift:    Labs:  Recent Labs  12/05/14 0742 12/05/14 1543  WBC 10.5  --   HGB 11.3*  --   PLT 138*  --   CREATININE 1.49* 1.33   Estimated Creatinine Clearance: 73.1 mL/min (by C-G formula based on Cr of 1.33). No results for input(s): VANCOTROUGH, VANCOPEAK, VANCORANDOM, GENTTROUGH, GENTPEAK, GENTRANDOM, TOBRATROUGH, TOBRAPEAK, TOBRARND, AMIKACINPEAK, AMIKACINTROU, AMIKACIN in the last 72 hours.   Microbiology: No results found for this or any previous visit (from the past 720 hour(s)).  Medical History: Past Medical History  Diagnosis Date  . Hypertension   . Gout   . Hepatitis C   . Liver disorder     found 2 spots  4 years ago, last report "pretty good"  . Kidney disease     1 spot on kidney, stable at last check 9 months ago  . Knee joint pain 09/23/11    needs replacements on both  . Hepatocellular carcinoma     Metastasis includes renal mass.    Medications:  Prescriptions prior to admission  Medication Sig Dispense Refill Last Dose  . morphine (MS CONTIN) 15 MG 12 hr tablet Take 15 mg by mouth every 12 (twelve) hours. scheduled   12/03/2014  . oxyCODONE (OXY IR/ROXICODONE) 5 MG immediate release tablet Take 2 tablets (10 mg total) by mouth every 4 (four) hours as needed for severe pain. 30 tablet 0 1/19 or 1/20   Assessment: 64 yo M admitted 12/05/2014 with progressive pain, reddness & swelling of left foot.  Pharmacy consulted to start cefazolin for cellulitis.  ID: cellulitis, afebrile, non-purulent.   Cefazolin 1/22>>  Goal of Therapy:   Renal adjustment of medications  Plan:  Cefazolin 2g IV q8h Follow up SCr, UOP, cultures, clinical course and adjust as clinically indicated.   Thank you for allowing pharmacy to be a part of this patients care team.  Rowe Robert Pharm.D., BCPS, AQ-Cardiology Clinical Pharmacist 12/05/2014 7:19 PM Pager: [phone] Phone: [phone]

## 2014-12-05 NOTE — ED Notes (Signed)
Ordered meal tray for patient

## 2014-12-05 NOTE — Progress Notes (Signed)
Pt in room 4N06. Pt oriented to room and equipment. Pt in no acute distress. VSs stable. Will continue to monitor.

## 2014-12-05 NOTE — H&P (Signed)
Date: 12/05/2014               Patient Name:  Bryan Reyes MRN: [id number]  DOB: Mar 25, 1951 Age / Sex: 64 y.o., male   PCP: No primary care provider on file.         Medical Service: Internal Medicine Teaching Service         Attending Physician: Dr. Axel Filler, MD    First Contact: Dr. Marvel Plan Pager: [phone]  Second Contact: Dr. Ronnald Ramp Pager: [phone]       After Hours (After 5p/  First Contact Pager: [phone]  weekends / holidays): Second Contact Pager: [phone]   Chief Complaint: foot pain  History of Present Illness: Mr. Colgate is a 64 yo male with PMHx of gout, HTN, Hepatocelluler carcinoma with mestatses, alcohol abuse and osteomyelitis of L foot s/p amputation who presents with L foot pain. Patient states he has a history of gout, but has not had a gout flare in 3-4 years. He has had medications for it at home previously, but only takes pain medications for it now. He states the flare started 3-4 days ago in both feet, he cannot localize pain, but states it has been hot, red, and swollen similar to past gout flare episodes. He has not attempted to ambulate on them. Patient cannot walk and does not have anyone at home to care for him.  Patient also admits to a history of hepatocellular cancer from Hepatitis C which was diagnosed 6-7 years ago. [facility] who prescribes him pain medication. He has been told there is no treatment for him and he was given several weeks to live and that was 4-5 months ago. He has chronic abdominal pain and pain is currently shooting from right to left. His last bowel movement was 3-4 days ago. He denies any nausea, vomiting, diarrhea.  Meds: Current Facility-Administered Medications  Medication Dose Route Frequency Provider Last Rate Last Dose  . carvedilol (COREG) tablet 6.25 mg  6.25 mg Oral BID WC Corky Sox, MD      . colchicine tablet 0.6 mg  0.6 mg Oral BID Corky Sox, MD      . furosemide  (LASIX) tablet 40 mg  40 mg Oral Once Alexa Marvel Plan, MD      . ipratropium-albuterol (DUONEB) 0.5-2.5 (3) MG/3ML nebulizer solution 3 mL  3 mL Nebulization Q4H PRN Alexa Richardson, MD      . oxyCODONE (Oxy IR/ROXICODONE) immediate release tablet 5 mg  5 mg Oral Q4H PRN Corky Sox, MD      . spironolactone (ALDACTONE) tablet 50 mg  50 mg Oral Daily Corky Sox, MD        Allergies: Allergies as of 12/05/2014  . (No Known Allergies)   Past Medical History  Diagnosis Date  . Hypertension   . Gout   . Hepatitis C   . Liver disorder     found 2 spots  4 years ago, last report "pretty good"  . Kidney disease     1 spot on kidney, stable at last check 9 months ago  . Knee joint pain 09/23/11    needs replacements on both  . Hepatocellular carcinoma     Metastasis includes renal mass.   Past Surgical History  Procedure Laterality Date  . Amputation  09/27/2011    Procedure: AMPUTATION DIGIT;  Surgeon: Wylene Simmer, MD;  Location: [facility];  Service: Orthopedics;  Laterality: Left;  Left second toe amputation   Family History  Problem Relation Age of Onset  . Diabetes Mother   . Hypertension Mother   . Diabetes Father   . Hypertension Father    History   Social History  . Marital Status: Married    Spouse Name: N/A    Number of Children: N/A  . Years of Education: N/A   Occupational History  . Not on file.   Social History Main Topics  . Smoking status: Current Every Day Smoker  . Smokeless tobacco: Never Used  . Alcohol Use: 0.6 oz/week    1 Cans of beer per week     Comment: 1 to 2 beers daily  . Drug Use: Yes     Comment: History of cocaine use and marijuana   . Sexual Activity: Yes    Birth Control/ Protection: Condom   Other Topics Concern  . Not on file   Social History Narrative    Review of Systems: General: Admits to decreased appetite. Denies fever, chills Respiratory: Admits to SOB. Denies cough Cardiovascular: Denies chest pain and  palpitations.  Gastrointestinal: Admits to constipation and abdominal distention. Denies nausea, vomiting, abdominal pain, diarrhea, blood in stool. Musculoskeletal: Admits to bilateral foot pain, worst in left foot, redness, swelling and increased warmth.  Neurological: Denies dizziness, headaches, weakness, lightheadedness  Physical Exam: Filed Vitals:   12/05/14 1002 12/05/14 1130 12/05/14 1230 12/05/14 1331  BP: 125/83 129/79 133/86 140/88  Pulse: 76 77 74 76  Temp:    97.7 F (36.5 C)  TempSrc:    Oral  Resp: 18 16 18    Height:    6' (1.829 m)  Weight:    108.183 kg (238 lb 8 oz)  SpO2: 94% 92% 93% 94%   General: Vital signs reviewed.  Patient is well-developed and well-nourished, in no acute distress and cooperative with exam.  Neck: + JVD Cardiovascular: RRR, S1 normal, S2 normal Pulmonary/Chest: Diffuse inspiratory and expiratory wheezes and rhonchi. No rales. Abdominal: Markedly distended, tender, BS +, no guarding present.  Extremities: 2-3+ pitting edema bilaterally extending to mid shin, erythema from dorsal feet to mid shin, increased warmth, tender on palapation.  Neurological: A&O x 3, somnolent, lethargic Skin: scabs and lesions diffusely Psychiatric: Normal mood and affect. speech and behavior is normal. Cognition and memory are normal.   Lab results: Basic Metabolic Panel:  Recent Labs  12/05/14 0742  NA 131*  K 3.6  CL 96  CO2 30  GLUCOSE 110*  BUN 16  CREATININE 1.49*  CALCIUM 7.9*   CBC:  Recent Labs  12/05/14 0742  WBC 10.5  NEUTROABS 6.6  HGB 11.3*  HCT 32.9*  MCV 97.1  PLT 138*    Assessment & Plan by Problem: Active Problems:   Gout   Acute kidney injury  Acute Gout Flare: Patient presents with left foot pain with history of gout. Feet are edematous, tender, erythematous and warm to touch. Patient only takes pain medications at home. Patient is afebrile, BP 143/86, HR 79, 22, 98% on room air on admission. Patient received  solumedrol 125 mg once and dilaudid 1 mg once in the ED. -Colchicine 0.6 mg BID -Oxycodone IR 5 mg Q4H prn -CBC/CMET tomorrow am -Cardiac Diet  URI: Patient has wheezing and rhonchi on exam. Complained of sore throat but denies cough. No history of asthma or COPD. Afebrile with leukocytosis of 10.5. -Duonebs Q4H prn -Supplemental oxygen -CXR 2 view  HTN: Normotensive. Patient is on  amlodipine 10 mg daily, carvedilol 6.25 mg BID, lasix 20 mg daily, and spironolactone at home. Patient has not been taking any of his home medications, just pain medications. -Carvedilol 6.25 mg BID -Lasix 20 mg daily -Spironolactone 50 mg daily  Hepatocellular Carcinoma with Metastases: History of [place] with metastases currently following a hospice physician at the [place], but patient does not state he is on Hospice. Only receiving pain medication, not a candidate for treatment.  -Oxycodone IR 5 mg Q4H prn -Ammonia level -DNR confirmed -PT/OT -Oxygen as needed -Lasix 20 mg daily -Protime-INR  DVT/PE ppx: SCDs, TED hose  Dispo: Disposition is deferred at this time, awaiting improvement of current medical problems. Anticipated discharge in approximately 1-2 day(s).   The patient does have a current PCP (No primary care provider on file.) and does not need an [facility] hospital follow-up appointment after discharge.  The patient does have transportation limitations that hinder transportation to clinic appointments.  Signed: Osa Craver, DO PGY-1 Internal Medicine Resident Pager # [phone] 12/05/2014 3:13 PM

## 2014-12-06 LAB — BASIC METABOLIC PANEL
Anion gap: 10 (ref 5–15)
BUN: 15 mg/dL (ref 6–23)
CALCIUM: 8 mg/dL — AB (ref 8.4–10.5)
CHLORIDE: 100 mmol/L (ref 96–112)
CO2: 24 mmol/L (ref 19–32)
CREATININE: 1.14 mg/dL (ref 0.50–1.35)
GFR calc Af Amer: 78 mL/min — ABNORMAL LOW (ref >90)
GFR calc non Af Amer: 67 mL/min — ABNORMAL LOW (ref >90)
Glucose, Bld: 121 mg/dL — ABNORMAL HIGH (ref 70–99)
Potassium: 4.2 mmol/L (ref 3.5–5.1)
SODIUM: 134 mmol/L — AB (ref 135–145)

## 2014-12-06 LAB — CBC
HCT: 30.2 % — ABNORMAL LOW (ref 39.0–52.0)
Hemoglobin: 10.3 g/dL — ABNORMAL LOW (ref 13.0–17.0)
MCH: 33.1 pg (ref 26.0–34.0)
MCHC: 34.1 g/dL (ref 30.0–36.0)
MCV: 97.1 fL (ref 78.0–100.0)
Platelets: 122 10*3/uL — ABNORMAL LOW (ref 150–400)
RBC: 3.11 MIL/uL — ABNORMAL LOW (ref 4.22–5.81)
RDW: 15.3 % (ref 11.5–15.5)
WBC: 11.2 10*3/uL — ABNORMAL HIGH (ref 4.0–10.5)

## 2014-12-06 MED ORDER — PREDNISONE 20 MG PO TABS
40.0000 mg | ORAL_TABLET | Freq: Every day | ORAL | Status: DC
  Administered 2014-12-06 – 2014-12-07 (×2): 40 mg via ORAL
  Filled 2014-12-06 (×2): qty 2

## 2014-12-06 MED ORDER — HYDROCOD POLST-CHLORPHEN POLST 10-8 MG/5ML PO LQCR
5.0000 mL | Freq: Two times a day (BID) | ORAL | Status: DC | PRN
  Administered 2014-12-06 – 2014-12-08 (×4): 5 mL via ORAL
  Filled 2014-12-06 (×4): qty 5

## 2014-12-06 MED ORDER — COLCHICINE 0.6 MG PO TABS
0.6000 mg | ORAL_TABLET | Freq: Every day | ORAL | Status: DC
Start: 2014-12-06 — End: 2014-12-08
  Administered 2014-12-06 – 2014-12-08 (×3): 0.6 mg via ORAL
  Filled 2014-12-06 (×3): qty 1

## 2014-12-06 NOTE — Progress Notes (Signed)
PT Cancellation Note  Patient Details Name: Bryan Reyes MRN: [id number] DOB: 11/07/1951   Cancelled Treatment:    Reason Eval/Treat Not Completed: Patient declined, no reason specified Woke patient up from sound sleep, pt refused to work with therapy. Reports not being out of bed since hospitalization. Will follow up next available time.   Candy Sledge A 12/06/2014, 12:27 PM  Candy Sledge, [place], DPT [phone]

## 2014-12-06 NOTE — Progress Notes (Signed)
Subjective:  Patient was seen and examined this morning. Patient continues to have pain in his lower extremities- "in the crease," worst in his left foot. He continues to have a productive cough, fever, and chills.   Objective: Vital signs in last 24 hours: Filed Vitals:   12/05/14 2231 12/06/14 0231 12/06/14 0500 12/06/14 0635  BP: 121/84 112/66  121/70  Pulse: 75 81  72  Temp: 98.4 F (36.9 C) 98.4 F (36.9 C)  98.4 F (36.9 C)  TempSrc: Oral Oral  Oral  Resp: 18 18  18   Height:      Weight:   111.131 kg (245 lb)   SpO2: 95% 96%  95%   Weight change:   Intake/Output Summary (Last 24 hours) at 12/06/14 1102 Last data filed at 12/06/14 3825  Gross per 24 hour  Intake      0 ml  Output   1275 ml  Net  -1275 ml   General: Vital signs reviewed. Patient is well-developed and well-nourished, in no acute distress and cooperative with exam.  Neck: + JVD Cardiovascular: RRR, S1 normal, S2 normal Pulmonary/Chest: Diffuse inspiratory and expiratory wheezes and rhonchi. No rales. Abdominal: Markedly distended, tender, BS +, no guarding present.  Extremities: 1+ pitting edema bilaterally extending to mid shin, erythema from dorsal feet to mid shin, increased warmth, tender on palpation-worst on left.  Neurological: A&O x 3 Skin: Scabs and lesions diffusely Psychiatric: Normal mood and affect. speech and behavior is normal. Cognition and memory are normal.   Lab Results: Basic Metabolic Panel:  Recent Labs Lab 12/05/14 1543 12/06/14 0435  NA 133* 134*  K 4.7 4.2  CL 99 100  CO2 32 24  GLUCOSE 153* 121*  BUN 15 15  CREATININE 1.33 1.14  CALCIUM 8.0* 8.0*  MG 1.9  --    Liver Function Tests:  Recent Labs Lab 12/05/14 1543  AST 122*  ALT 31  ALKPHOS 239*  BILITOT 2.3*  PROT 6.9  ALBUMIN 2.0*    Recent Labs Lab 12/05/14 1543  AMMONIA 27   CBC:  Recent Labs Lab 12/05/14 0742 12/06/14 0435  WBC 10.5 11.2*  NEUTROABS 6.6  --   HGB 11.3* 10.3*  HCT  32.9* 30.2*  MCV 97.1 97.1  PLT 138* 122*   Coagulation:  Recent Labs Lab 12/05/14 1543  LABPROT 15.9*  INR 1.26   Studies/Results: Dg Chest 2 View  12/05/2014   CLINICAL DATA:  Cough.  Shortness of breath.  EXAM: CHEST  2 VIEW  COMPARISON:  09/23/2011.  FINDINGS: Mediastinum and hilar structures normal. Minimal infiltrate right upper lobe cannot be excluded . Mild cardiomegaly. No CHF. No pleural effusion or pneumothorax. No acute bony abnormality.  IMPRESSION: Minimal infiltrate right upper lobe cannot be excluded.   Electronically Signed   By: Marcello Moores  Register   On: 12/05/2014 16:11   Medications:  I have reviewed the patient's current medications. Prior to Admission:  Prescriptions prior to admission  Medication Sig Dispense Refill Last Dose  . morphine (MS CONTIN) 15 MG 12 hr tablet Take 15 mg by mouth every 12 (twelve) hours. scheduled   12/03/2014  . oxyCODONE (OXY IR/ROXICODONE) 5 MG immediate release tablet Take 2 tablets (10 mg total) by mouth every 4 (four) hours as needed for severe pain. 30 tablet 0 1/19 or 1/20   Scheduled Meds: . carvedilol  6.25 mg Oral BID WC  .  ceFAZolin (ANCEF) IV  2 g Intravenous 3 times per day  . colchicine  0.6 mg Oral Daily  . furosemide  20 mg Oral Daily  . predniSONE  40 mg Oral Q breakfast  . spironolactone  50 mg Oral Daily   Continuous Infusions:  PRN Meds:.chlorpheniramine-HYDROcodone, ipratropium-albuterol, oxyCODONE Assessment/Plan: Active Problems:   Gout   Acute kidney injury  LE Cellulitis versus Gout Flare: Clinical picture fits lower extremity cellulitis, but could also have a gout flare component. Patient continues to have pain, erythema, and increased warmth bilaterally, but worse on left. Edema is improving after lasix. Patient is afebrile, but admits to fever and chills and has a mild leukocytosis at 11.2. We will continue to cover for both. -Colchicine 0.6 mg daily -Prednisone 40 mg daily -Oxycodone IR 5 mg Q4H  prn -CBC/CMET tomorrow am -Cardiac Diet -Ancef IV Q8H   URI: Patient continues to have wheezing and rhonchi on exam with productive cough. CXR showed minimal infiltrate right upper lobe which couldn't be excluded. However, not impressive for pneumonia.  -Duonebs Q4H prn -Supplemental oxygen -Tussionex 5 mL BID  HTN: Normotensive. Patient is on amlodipine 10 mg daily, carvedilol 6.25 mg BID, lasix 20 mg daily, and spironolactone at home. -Carvedilol 6.25 mg BID -Lasix 20 mg daily -Spironolactone 50 mg daily  Hepatocellular Carcinoma with Metastases: History of [place] with metastases currently following a hospice physician at the [place]. Ammonia level normal at 27. PT 15.9, INR 1.26.  -Oxycodone IR 5 mg Q4H prn -DNR  -PT/OT -Oxygen as needed -Lasix 20 mg daily  DVT/PE ppx: SCDs, TED hose  Dispo: Disposition is deferred at this time, awaiting improvement of current medical problems.  Anticipated discharge in approximately 1-2 day(s).   The patient does not have a current PCP (No primary care provider on file.) and does need an [facility] hospital follow-up appointment after discharge.  The patient does have transportation limitations that hinder transportation to clinic appointments.  .Services Needed at time of discharge: Y = Yes, Blank = No PT:   OT:   RN:   Equipment:   Other:     LOS: 1 day   Osa Craver, DO PGY-1 Internal Medicine Resident Pager # [phone] 12/06/2014 11:02 AM

## 2014-12-07 LAB — COMPREHENSIVE METABOLIC PANEL
ALK PHOS: 248 U/L — AB (ref 39–117)
ALT: 28 U/L (ref 0–53)
AST: 126 U/L — ABNORMAL HIGH (ref 0–37)
Albumin: 1.9 g/dL — ABNORMAL LOW (ref 3.5–5.2)
Anion gap: 5 (ref 5–15)
BUN: 16 mg/dL (ref 6–23)
CHLORIDE: 101 mmol/L (ref 96–112)
CO2: 29 mmol/L (ref 19–32)
Calcium: 7.9 mg/dL — ABNORMAL LOW (ref 8.4–10.5)
Creatinine, Ser: 1.13 mg/dL (ref 0.50–1.35)
GFR calc Af Amer: 79 mL/min — ABNORMAL LOW (ref >90)
GFR, EST NON AFRICAN AMERICAN: 68 mL/min — AB (ref >90)
GLUCOSE: 106 mg/dL — AB (ref 70–99)
Potassium: 3.8 mmol/L (ref 3.5–5.1)
SODIUM: 135 mmol/L (ref 135–145)
TOTAL PROTEIN: 7.1 g/dL (ref 6.0–8.3)
Total Bilirubin: 1 mg/dL (ref 0.3–1.2)

## 2014-12-07 LAB — CBC
HCT: 33.3 % — ABNORMAL LOW (ref 39.0–52.0)
HEMOGLOBIN: 11.5 g/dL — AB (ref 13.0–17.0)
MCH: 33.7 pg (ref 26.0–34.0)
MCHC: 34.5 g/dL (ref 30.0–36.0)
MCV: 97.7 fL (ref 78.0–100.0)
Platelets: 153 10*3/uL (ref 150–400)
RBC: 3.41 MIL/uL — ABNORMAL LOW (ref 4.22–5.81)
RDW: 15.4 % (ref 11.5–15.5)
WBC: 12.4 10*3/uL — ABNORMAL HIGH (ref 4.0–10.5)

## 2014-12-07 MED ORDER — POLYETHYLENE GLYCOL 3350 17 G PO PACK
17.0000 g | PACK | Freq: Every day | ORAL | Status: AC
  Administered 2014-12-07 – 2014-12-08 (×2): 17 g via ORAL
  Filled 2014-12-07 (×2): qty 1

## 2014-12-07 MED ORDER — PREDNISONE 20 MG PO TABS
20.0000 mg | ORAL_TABLET | Freq: Every day | ORAL | Status: DC
  Administered 2014-12-08: 20 mg via ORAL
  Filled 2014-12-07: qty 1

## 2014-12-07 MED ORDER — SENNOSIDES-DOCUSATE SODIUM 8.6-50 MG PO TABS
2.0000 | ORAL_TABLET | Freq: Two times a day (BID) | ORAL | Status: AC
  Administered 2014-12-07: 2 via ORAL
  Filled 2014-12-07: qty 2

## 2014-12-07 NOTE — Evaluation (Signed)
Physical Therapy Evaluation Patient Details Name: Bryan Reyes MRN: [id number] DOB: [birth year] Today's Date: 12/07/2014   History of Present Illness    Patient is a 64 year old man who gives history of history of gout, hypertension, and metastatic liver cancer managed at the [facility], status post amputation of the left second toe 09/27/2011 by Dr. Doran Durand for osteomyelitis, admitted with progressive pain, redness, and swelling of his left foot over the past few days. He says this is worse than his past gout attacks; the pain is so severe that he cannot walk.  Improved by 1/24   Clinical Impression  Pt presents with min to moderate limitations to functional mobility related to foot pain and limited activity.  Pt apparently lives in hotel, unclear whether he has social support.  Likely would benefit from HHPT to improve mobility but pt is nearing his usual functional level as foot pain improves.  Plan to see acutely and assess for f/u needs.  Pt has cane and RW and may benefit from CSW to assist with identifying community resources.  Recommend walk halls with nursing (RW in room) and OOB as often as able.  Thanks.    Follow Up Recommendations  (TBD)    Equipment Recommendations  None recommended by PT    Recommendations for Other Services  (CSW)     Precautions / Restrictions Precautions Precautions: Fall Precaution Comments: recommend use chair alarm when up in chair; RW in room, RN updated and pt specifically instructed to call for assist      Mobility  Bed Mobility Overal bed mobility: Modified Independent             General bed mobility comments: HOB up, pt uses rails, but able to move to EOB unassisted  Transfers Overall transfer level: Needs assistance Equipment used: Rolling walker (2 wheeled) Transfers: Sit to/from [facility] Sit to Stand: Min guard Stand pivot transfers: Min guard       General transfer comment: hands on assist for safety; pt  able to stand and buckle pants, uses walker appropriately, finds chair arms before sitting and minimal limitations observed secondary to pain  Ambulation/Gait Ambulation/Gait assistance:  (pt deferred, he wants to finish lunch)              Stairs            Wheelchair Mobility    Modified Rankin (Stroke Patients Only)       Balance Overall balance assessment: No apparent balance deficits (not formally assessed)                                           Pertinent Vitals/Pain Pain Assessment: 0-10 Pain Score: 2  Pain Location: feet L>R Pain Intervention(s): Monitored during session;Limited activity within patient's tolerance    Home Living Family/patient expects to be discharged to:: Unsure                 Additional Comments: Pt states he lives in a hotel    Prior Function Level of Independence: Independent with assistive device(s)         Comments: uses cane at baseline, has 4WW too     Hand Dominance        Extremity/Trunk Assessment   Upper Extremity Assessment: Defer to OT evaluation           Lower Extremity Assessment: Overall [facility] for  tasks assessed (minimal to no edema, pain nearly resolved)         Communication   Communication: No difficulties  Cognition Arousal/Alertness: Awake/alert Behavior During Therapy: WFL for tasks assessed/performed Overall Cognitive Status: Within Functional Limits for tasks assessed                      General Comments General comments (skin integrity, edema, etc.): minimal pedal/leg edema noted; d    Exercises        Assessment/Plan    PT Assessment Patient needs continued PT services  PT Diagnosis Difficulty walking;Acute pain   PT Problem List Pain;Decreased knowledge of use of DME;Decreased mobility;Decreased balance  PT Treatment Interventions DME instruction;Gait training;Functional mobility training;Therapeutic activities;Balance  training;Patient/family education   PT Goals (Current goals can be found in the Care Plan section) Acute Rehab PT Goals Patient Stated Goal: no pain PT Goal Formulation: With patient/family Time For Goal Achievement: 12/14/14 Potential to Achieve Goals: Good    Frequency Min 3X/week   Barriers to discharge Decreased caregiver support;Inaccessible home environment unlikely he has support or formal home    Co-evaluation               End of Session Equipment Utilized During Treatment: Gait belt;Oxygen (O2 per [place] ) Activity Tolerance: Patient tolerated treatment well Patient left: in chair;with nursing/sitter in room;with call bell/phone within reach (door open; recommended chair alarm to RN) Nurse Communication: Mobility status;Precautions         Time: [id number] PT Time Calculation (min) (ACUTE ONLY): 25 min   Charges:   PT Evaluation $Initial PT Evaluation Tier I: 1 Procedure PT Treatments $Therapeutic Activity: 8-22 mins   PT G Codes:        Herbie Drape 12/07/2014, 12:24 PM

## 2014-12-07 NOTE — Evaluation (Signed)
Occupational Therapy Evaluation and Discharge Patient Details Name: Bryan Reyes MRN: [id number] DOB: [birth year] Today's Date: 12/07/2014    History of Present Illness Pt is a 64 y.o. Male admitted 12/05/14. PMH of gout, hypertension, and metastatic liver cancer managed at the [facility], status post amputation of the left second toe 09/27/2011 by Dr. Doran Durand for osteomyelitis, admitted with progressive pain, redness, and swelling of his left foot over the past few days.   He says this is worse than his past gout attacks; the pain is so severe that he cannot walk.  Improved by 1/24   Clinical Impression   PTA pt lived alone and was independent with use of SPC. Pt is close to baseline and would continue to recommend Supervision for functional mobility while inpatient, however is at Mod I with ADLs. Pt has no further acute OT needs and reports that pain in LLE is resolving. Acute OT to sign off.     Follow Up Recommendations  No OT follow up;Supervision - Intermittent    Equipment Recommendations  Tub/shower seat    Recommendations for Other Services       Precautions / Restrictions Precautions Precautions: Fall Restrictions Weight Bearing Restrictions: No      Mobility Bed Mobility Overal bed mobility: Modified Independent                Transfers Overall transfer level: Modified independent Equipment used: Rolling walker (2 wheeled)                  Balance Overall balance assessment: No apparent balance deficits (not formally assessed)                                          ADL Overall ADL's : Modified independent                                       General ADL Comments: Pt was Mod I for ADLs with use of RW. Pt able to adjust socks bilaterally by sitting EOB and reaching feet. Pt demonstrates good sit<>stand transfer.Would continue to recommend Supervision for functional mobility while inpatient.      Vision  Pt  wears glasses and reports no change from baseline.                    Perception Perception Perception Tested?: No   Praxis Praxis Praxis tested?: Within functional limits    Pertinent Vitals/Pain Pain Assessment: No/denies pain     Hand Dominance Right   Extremity/Trunk Assessment Upper Extremity Assessment Upper Extremity Assessment: Overall WFL for tasks assessed   Lower Extremity Assessment Lower Extremity Assessment: Overall WFL for tasks assessed   Cervical / Trunk Assessment Cervical / Trunk Assessment: Normal   Communication Communication Communication: No difficulties   Cognition Arousal/Alertness: Awake/alert Behavior During Therapy: WFL for tasks assessed/performed Overall Cognitive Status: Within Functional Limits for tasks assessed                                Home Living Family/patient expects to be discharged to:: Other (Comment) (hotel)  Additional Comments: Pt states he lives in a hotel. He reports that he has a cane, 4WW, and a hospital bed.       Prior Functioning/Environment Level of Independence: Independent with assistive device(s)        Comments: uses cane at baseline, has 4WW too    OT Diagnosis: Acute pain                End of Session Equipment Utilized During Treatment: Gait belt;Rolling walker Nurse Communication: Other (comment) (pt sitting EOB)  Activity Tolerance: Patient tolerated treatment well Patient left: Other (comment);with call bell/phone within reach;with family/visitor present;with bed alarm set   Time: [phone] OT Time Calculation (min): 23 min Charges:  OT General Charges $OT Visit: 1 Procedure OT Evaluation $Initial OT Evaluation Tier I: 1 Procedure OT Treatments $Self Care/Home Management : 8-22 mins G-Codes:    Juluis Rainier 12/29/14, 5:23 PM  Cyndie Chime, OTR/L Occupational Therapist [phone] (pager)

## 2014-12-07 NOTE — Progress Notes (Signed)
Subjective: LE swelling and pain significantly improved this AM. Edema almost completely resolved. Says he is feeling much better today. Also says he will agree to work w/ PT today.   Objective: Vital signs in last 24 hours: Filed Vitals:   12/06/14 2207 12/07/14 0109 12/07/14 0500 12/07/14 0645  BP: 144/83 152/83  165/82  Pulse: 66 58  59  Temp: 98.4 F (36.9 C) 98 F (36.7 C)  97.8 F (36.6 C)  TempSrc: Oral Oral  Oral  Resp: 16 22  20   Height:      Weight:   242 lb 4.8 oz (109.907 kg)   SpO2: 99% 100%  99%   Weight change: 3 lb 12.8 oz (1.724 kg)  Intake/Output Summary (Last 24 hours) at 12/07/14 0810 Last data filed at 12/07/14 0646  Gross per 24 hour  Intake      0 ml  Output    750 ml  Net   -750 ml   Physical Exam: General: AA male, Alert, cooperative, NAD. HEENT: PERRL, EOMI. Moist mucus membranes Neck: Full range of motion without pain, supple, no lymphadenopathy or carotid bruits Lungs: Normal work of respiration, no rales or rhonchi. Mild end-expiratory wheezes on exam.  Heart: RRR, no murmurs, gallops, or rubs Abdomen: Soft, mildly tender, quite distended, BS +. Extremities: No cyanosis or clubbing. No further edema bilaterally. Pain and erythema almost completely resolved.  Neurologic: Alert & oriented X3, cranial nerves II-XII intact, strength grossly intact, sensation intact to light touch    Lab Results: Basic Metabolic Panel:  Recent Labs Lab 12/05/14 1543 12/06/14 0435 12/07/14 0417  NA 133* 134* 135  K 4.7 4.2 3.8  CL 99 100 101  CO2 32 24 29  GLUCOSE 153* 121* 106*  BUN 15 15 16   CREATININE 1.33 1.14 1.13  CALCIUM 8.0* 8.0* 7.9*  MG 1.9  --   --    Liver Function Tests:  Recent Labs Lab 12/05/14 1543 12/07/14 0417  AST 122* 126*  ALT 31 28  ALKPHOS 239* 248*  BILITOT 2.3* 1.0  PROT 6.9 7.1  ALBUMIN 2.0* 1.9*    Recent Labs Lab 12/05/14 1543  AMMONIA 27   CBC:  Recent Labs Lab 12/05/14 0742 12/06/14 0435  12/07/14 0417  WBC 10.5 11.2* 12.4*  NEUTROABS 6.6  --   --   HGB 11.3* 10.3* 11.5*  HCT 32.9* 30.2* 33.3*  MCV 97.1 97.1 97.7  PLT 138* 122* 153   Coagulation:  Recent Labs Lab 12/05/14 1543  LABPROT 15.9*  INR 1.26   Studies/Results: Dg Chest 2 View  12/05/2014   CLINICAL DATA:  Cough.  Shortness of breath.  EXAM: CHEST  2 VIEW  COMPARISON:  09/23/2011.  FINDINGS: Mediastinum and hilar structures normal. Minimal infiltrate right upper lobe cannot be excluded . Mild cardiomegaly. No CHF. No pleural effusion or pneumothorax. No acute bony abnormality.  IMPRESSION: Minimal infiltrate right upper lobe cannot be excluded.   Electronically Signed   By: Marcello Moores  Register   On: 12/05/2014 16:11   Medications:  I have reviewed the patient's current medications. Prior to Admission:  Prescriptions prior to admission  Medication Sig Dispense Refill Last Dose  . morphine (MS CONTIN) 15 MG 12 hr tablet Take 15 mg by mouth every 12 (twelve) hours. scheduled   12/03/2014  . oxyCODONE (OXY IR/ROXICODONE) 5 MG immediate release tablet Take 2 tablets (10 mg total) by mouth every 4 (four) hours as needed for severe pain. 30 tablet 0 1/19 or  1/20   Scheduled Meds: . carvedilol  6.25 mg Oral BID WC  .  ceFAZolin (ANCEF) IV  2 g Intravenous 3 times per day  . colchicine  0.6 mg Oral Daily  . furosemide  20 mg Oral Daily  . predniSONE  40 mg Oral Q breakfast  . spironolactone  50 mg Oral Daily   Continuous Infusions:  PRN Meds:.chlorpheniramine-HYDROcodone, ipratropium-albuterol, oxyCODONE   Assessment/Plan: 64 y/o M w/ PMHx of metastatic HCC 2/2 Hepatitis C, HTN, and Gout, admitted for bilateral LE swelling and pain, 2/2 cellulitis vs Gout flare.   LE Cellulitis versus Gout Flare: Significant improvement on exam today. No further edema, erythema, and swelling. Only mild tenderness over the left dorsum of the foot.   -Continue Ancef IV; transition to Keflex in AM -Continue Colchicine 0.6 mg  daily -Continue Prednisone 40 mg daily; taper to 20 mg tomorrow.  -Oxy-IR 5 mg q4h prn for pain.   URI: Most likely viral bronchitis. Still w/ very mild wheezing on exam, still w/ dry cough.  -Continue Duonebs q4h prn -Continue supplemental oxygen -Continue Tussionex 5 ml bid  HTN: Normotensive. Patient is on amlodipine 10 mg daily, carvedilol 6.25 mg BID, lasix 20 mg daily, and spironolactone at home. -Continue Carvedilol 6.25 mg bid, Lasix 20 mg daily, Spironolactone 50 mg daily  HCC with Abdominal Metastases: History of [place] with metastases currently following a hospice physician at the [place]. Patient is DNR. -Continue Oxy-IR 5 mg q4h prn -PT/OT pending -Continue Lasix + Spironolactone as above.   DVT/PE PPx: SCD's  Dispo: Disposition is deferred at this time, awaiting improvement of current medical problems.  Anticipated discharge in approximately 1-2 day(s).   The patient does not have a current PCP (No primary care provider on file.) and does need an [facility] hospital follow-up appointment after discharge.  The patient does have transportation limitations that hinder transportation to clinic appointments.  .Services Needed at time of discharge: Y = Yes, Blank = No PT:   OT:   RN:   Equipment:   Other:     LOS: 2 days   Signed: Luanne Bras, MD 12/07/2014 8:14 AM

## 2014-12-07 NOTE — Progress Notes (Signed)
Utilization Review Completed.   Trystin Hargrove, RN, BSN Nurse Case Manager  

## 2014-12-08 DIAGNOSIS — M10372 Gout due to renal impairment, left ankle and foot: Secondary | ICD-10-CM | POA: Insufficient documentation

## 2014-12-08 DIAGNOSIS — L03119 Cellulitis of unspecified part of limb: Secondary | ICD-10-CM

## 2014-12-08 DIAGNOSIS — C220 Liver cell carcinoma: Secondary | ICD-10-CM

## 2014-12-08 MED ORDER — OXYCODONE HCL 5 MG PO TABS: 10.0000 mg | ORAL_TABLET | ORAL | Status: AC | PRN

## 2014-12-08 MED ORDER — CEPHALEXIN 500 MG PO CAPS: 500.0000 mg | ORAL_CAPSULE | Freq: Four times a day (QID) | ORAL | Status: DC

## 2014-12-08 MED ORDER — PREDNISONE 10 MG PO TABS: 10.0000 mg | ORAL_TABLET | Freq: Every day | ORAL | Status: AC

## 2014-12-08 MED ORDER — ALBUTEROL SULFATE HFA 108 (90 BASE) MCG/ACT IN AERS: 2.0000 | INHALATION_SPRAY | Freq: Four times a day (QID) | RESPIRATORY_TRACT | Status: AC | PRN

## 2014-12-08 MED ORDER — CEPHALEXIN 500 MG PO CAPS: 500.0000 mg | ORAL_CAPSULE | Freq: Four times a day (QID) | ORAL | Status: AC

## 2014-12-08 MED ORDER — HYDROCOD POLST-CHLORPHEN POLST 10-8 MG/5ML PO LQCR: 5.0000 mL | Freq: Two times a day (BID) | ORAL | Status: AC | PRN

## 2014-12-08 NOTE — Discharge Summary (Signed)
Name: Bryan Reyes MRN: [id number] DOB: 1951-07-18 64 y.o. PCP: No primary care provider on file.  Date of Admission: 12/05/2014  6:31 AM Date of Discharge: 12/08/2014 Attending Physician: Aldine Contes, MD  Discharge Diagnosis:  Principal Problem:   Lower extremity cellulitis Active Problems:   HTN (hypertension)   Gout   Hepatocellular carcinoma  Discharge Medications:   Medication List    TAKE these medications        albuterol 108 (90 BASE) MCG/ACT inhaler  Commonly known as:  PROVENTIL HFA;VENTOLIN HFA  Inhale 2 puffs into the lungs every 6 (six) hours as needed for wheezing or shortness of breath.     cephALEXin 500 MG capsule  Commonly known as:  KEFLEX  Take 1 capsule (500 mg total) by mouth 4 (four) times daily.     chlorpheniramine-HYDROcodone 10-8 MG/5ML Lqcr  Commonly known as:  TUSSIONEX  Take 5 mLs by mouth every 12 (twelve) hours as needed for cough.     morphine 15 MG 12 hr tablet  Commonly known as:  MS CONTIN  Take 15 mg by mouth every 12 (twelve) hours. scheduled     oxyCODONE 5 MG immediate release tablet  Commonly known as:  Oxy IR/ROXICODONE  Take 2 tablets (10 mg total) by mouth every 4 (four) hours as needed for severe pain.     predniSONE 10 MG tablet  Commonly known as:  DELTASONE  Take 1 tablet (10 mg total) by mouth daily with breakfast. Take 2 pills once a day for 4 days and then 1 pill once a day for 4 days.        Disposition and follow-up:   Mr.Bryan Reyes was discharged from [facility] in Good condition.  At the hospital follow up visit please address:  1.  LE Cellulitis: Please assess resolution and compliance with medications.  Gout: Please assess resolution. Consider starting on daily preventative medication.  [place] with Metastases: Please assess pain control.   2.  Labs / imaging needed at time of follow-up: none  3.  Pending labs/ test needing follow-up: none  Follow-up  Appointments:   Discharge Instructions: Discharge Instructions    Diet - low sodium heart healthy    Complete by:  As directed      Increase activity slowly    Complete by:  As directed            Consultations:  None  Procedures Performed:  Dg Chest 2 View  12/05/2014   CLINICAL DATA:  Cough.  Shortness of breath.  EXAM: CHEST  2 VIEW  COMPARISON:  09/23/2011.  FINDINGS: Mediastinum and hilar structures normal. Minimal infiltrate right upper lobe cannot be excluded . Mild cardiomegaly. No CHF. No pleural effusion or pneumothorax. No acute bony abnormality.  IMPRESSION: Minimal infiltrate right upper lobe cannot be excluded.   Electronically Signed   By: Marcello Moores  Register   On: 12/05/2014 16:11   Admission HPI: [name] is a 64 yo male with PMHx of gout, HTN, Hepatocelluler carcinoma with mestatses, alcohol abuse and osteomyelitis of L foot s/p amputation who presents with L foot pain. Patient states he has a history of gout, but has not had a gout flare in 3-4 years. He has had medications for it at home previously, but only takes pain medications for it now. He states the flare started 3-4 days ago in both feet, he cannot localize pain, but states it has been hot, red, and swollen similar to past  gout flare episodes. He has not attempted to ambulate on them. Patient cannot walk and does not have anyone at home to care for him.  Patient also admits to a history of hepatocellular cancer from Hepatitis C which was diagnosed 6-7 years ago. [facility] who prescribes him pain medication. He has been told there is no treatment for him and he was given several weeks to live and that was 4-5 months ago. He has chronic abdominal pain and pain is currently shooting from right to left. His last bowel movement was 3-4 days ago. He denies any nausea, vomiting, diarrhea.  Hospital Course by problem list: Principal Problem:   Lower extremity cellulitis Active  Problems:   HTN (hypertension)   Gout   Hepatocellular carcinoma   Lower Extremity Cellulitis: Patient presented with left foot pain with history of gout. Feet were edematous, tender, erythematous and warm to touch. Patient only takes pain medications at home. Patient was afebrile, BP 143/86, HR 79, 22, 98% on room air on admission. Patient received solumedrol 125 mg once and dilaudid 1 mg once in the ED. Patient was started on Ancef and given lasix with great improvement of symptoms. Pain was managed with oxycodone IR 5 mg Q4H prn. Patient was transitioned to Keflex 500 mg Q6H for 3 more days for a total of 7 days of treatment. Patient will follow up with physician at [facility].  Acute Gout Flare: Clinical picture may have included gout flare as patient was very tender in feet and he stated pain was similar to prior gout attacks. Patient was started on colchicine 0.6 mg BID initially and then transitioned to colchicine 0.6 mg daily and prednisone 40 mg daily. Pain resolved and patient was discharged on prednisone taper.  URI: Patient had wheezing and rhonchi on exam. Complained of sore throat and productive cough. No history of asthma or COPD. Afebrile with leukocytosis of 10.5. CXR showed minimal infiltrate right upper lobe which couldn't be excluded. However, not impressive for pneumonia.Patient was discharged on Tussionex for cough, but was satting well on room air on discharge.   HTN: Patient remained normotensive. Patient is on amlodipine 10 mg daily, carvedilol 6.25 mg BID, lasix 20 mg daily, and spironolactone at home. Patient had not been taking any of his home medications, just pain medications. Patient was resumed on his home medication on discharge and encouraged to take them.   Hepatocellular Carcinoma with Metastases: Patient has a history of [place] with metastases currently following a hospice physician at the [place]. Patient states he only receives pain medication from [place] and is not a candidate for  treatment. [facility]. Discharged with oxycodone IR 5 mg Q4H prn.  Discharge Vitals:   BP 151/92 mmHg  Pulse 53  Temp(Src) 97.7 F (36.5 C) (Oral)  Resp 16  Ht 6' (1.829 m)  Wt 109.907 kg (242 lb 4.8 oz)  BMI 32.85 kg/m2  SpO2 95%  Signed: Osa Craver, DO PGY-1 Internal Medicine Resident Pager # [phone] 12/08/2014 4:04 PM

## 2014-12-08 NOTE — Progress Notes (Signed)
Discussed discharge plans with patient. He verbalized understanding. Stressed the importance of adhering to medication regimen and keeping follow up appts. Pt given 5 rx. Tranported to front lobby via wheelchair at       [employer] in [employer] [place] provided transportation to pt's home in [place].

## 2014-12-08 NOTE — Progress Notes (Signed)
Discharged at 26 with sister and brother in law providing transportation

## 2014-12-08 NOTE — Progress Notes (Signed)
Physical Therapy Treatment Patient Details Name: Bryan Reyes MRN: [id number] DOB: 09/11/1951 Today's Date: 12/08/2014    History of Present Illness Pt is a 63 y.o. Male admitted 12/05/14. PMH of gout, hypertension, and metastatic liver cancer managed at the [facility], status post amputation of the left second toe 09/27/2011 by Dr. Doran Durand for osteomyelitis, admitted with progressive pain, redness, and swelling of his left foot over the past few days.   He says this is worse than his past gout attacks; the pain is so severe that he cannot walk.  Improved by 1/24    PT Comments    Pt. Needed to be persuaded to get OOB and walk but compliant after getting up and doing as much as he wanted to do. Ambulation did stimulate a productive cough and was able to loosen up some secretions. Nursing was notified that he requested a breathing treatment after the session. Pt. Being d/c'd today.  Follow Up Recommendations        Equipment Recommendations  None recommended by PT    Recommendations for Other Services       Precautions / Restrictions Precautions Precautions: Fall Precaution Comments: recommend use chair alarm when up in chair; RW in room, RN updated and pt specifically instructed to call for assist Restrictions Weight Bearing Restrictions: No    Mobility  Bed Mobility Overal bed mobility: Modified Independent             General bed mobility comments: HOB up, pt uses rails, but able to move to EOB unassisted  Transfers Overall transfer level: Modified independent Equipment used: None Transfers: Sit to/from Stand Sit to Stand: Min guard         General transfer comment: supervision for safety and equipment. used hand rail to assist.   Ambulation/Gait Ambulation/Gait assistance: Supervision Ambulation Distance (Feet): 50 Feet Assistive device: None Gait Pattern/deviations: WFL(Within Functional Limits)   Gait velocity interpretation: at or above normal speed for  age/gender General Gait Details: pushed IV pole for support, declined use of RW.    Stairs            Wheelchair Mobility    Modified Rankin (Stroke Patients Only)       Balance Overall balance assessment: No apparent balance deficits (not formally assessed)                                  Cognition Arousal/Alertness: Awake/alert Behavior During Therapy: WFL for tasks assessed/performed Overall Cognitive Status: Within Functional Limits for tasks assessed                      Exercises      General Comments        Pertinent Vitals/Pain Pain Assessment: No/denies pain    Home Living                      Prior Function            PT Goals (current goals can now be found in the care plan section) Progress towards PT goals: Progressing toward goals    Frequency  Min 3X/week    PT Plan Current plan remains appropriate    Co-evaluation             End of Session Equipment Utilized During Treatment: Gait belt Activity Tolerance: Patient tolerated treatment well Patient left: in bed;with call bell/phone within reach;with  nursing/sitter in room     Time: 1450-1509 PT Time Calculation (min) (ACUTE ONLY): 19 min  Charges:                       G Codes:      Jodi Geralds,  [place] 12/08/2014, 3:20 PM

## 2014-12-08 NOTE — Progress Notes (Signed)
Subjective:  Patient seen and examined this morning. Patient continues to have cough, but denies shortness of breath. Abdominal pain controlled. Swelling and leg pain have resolved. Patient states he is walking without trouble and is ready to leave. He requests pain medicine on discharge.   Objective: Vital signs in last 24 hours: Filed Vitals:   12/07/14 2243 12/08/14 0133 12/08/14 0634 12/08/14 0956  BP: 133/71 149/83 180/97 141/94  Pulse: 54 51 62 57  Temp: 98 F (36.7 C) 98.1 F (36.7 C) 97.9 F (36.6 C) 97.9 F (36.6 C)  TempSrc: Oral Oral Oral Oral  Resp: 22 20  20   Height:      Weight:      SpO2: 96% 98% 97% 100%   Weight change:   Intake/Output Summary (Last 24 hours) at 12/08/14 0959 Last data filed at 12/07/14 1452  Gross per 24 hour  Intake     50 ml  Output      0 ml  Net     50 ml   Physical Exam: General: Alert, cooperative, NAD. Lungs: Normal work of respiration, no rales or rhonchi. Diffuse mild end-expiratory wheezes on exam.  Heart: RRR, no murmurs, gallops, or rubs Abdomen: Soft, mildly tender, distended, BS +. Extremities: 1+ non-pitting edema bilaterally up to ankles. Pain and erythema resolved.  Neurologic: Alert & oriented X3, cranial nerves II-XII intact, strength grossly intact, sensation intact to light touch  Lab Results: Basic Metabolic Panel:  Recent Labs Lab 12/05/14 1543 12/06/14 0435 12/07/14 0417  NA 133* 134* 135  K 4.7 4.2 3.8  CL 99 100 101  CO2 32 24 29  GLUCOSE 153* 121* 106*  BUN 15 15 16   CREATININE 1.33 1.14 1.13  CALCIUM 8.0* 8.0* 7.9*  MG 1.9  --   --    Liver Function Tests:  Recent Labs Lab 12/05/14 1543 12/07/14 0417  AST 122* 126*  ALT 31 28  ALKPHOS 239* 248*  BILITOT 2.3* 1.0  PROT 6.9 7.1  ALBUMIN 2.0* 1.9*    Recent Labs Lab 12/05/14 1543  AMMONIA 27   CBC:  Recent Labs Lab 12/05/14 0742 12/06/14 0435 12/07/14 0417  WBC 10.5 11.2* 12.4*  NEUTROABS 6.6  --   --   HGB 11.3* 10.3*  11.5*  HCT 32.9* 30.2* 33.3*  MCV 97.1 97.1 97.7  PLT 138* 122* 153   Coagulation:  Recent Labs Lab 12/05/14 1543  LABPROT 15.9*  INR 1.26   Medications:  I have reviewed the patient's current medications. Prior to Admission:  Prescriptions prior to admission  Medication Sig Dispense Refill Last Dose  . morphine (MS CONTIN) 15 MG 12 hr tablet Take 15 mg by mouth every 12 (twelve) hours. scheduled   12/03/2014  . oxyCODONE (OXY IR/ROXICODONE) 5 MG immediate release tablet Take 2 tablets (10 mg total) by mouth every 4 (four) hours as needed for severe pain. 30 tablet 0 1/19 or 1/20   Scheduled Meds: . carvedilol  6.25 mg Oral BID WC  .  ceFAZolin (ANCEF) IV  2 g Intravenous 3 times per day  . colchicine  0.6 mg Oral Daily  . furosemide  20 mg Oral Daily  . polyethylene glycol  17 g Oral Daily  . predniSONE  20 mg Oral Q breakfast  . 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   LE Cellulitis versus Gout Flare: Improved. Edema, erythema, and tenderness have resolved.  -Transition to po Keflex on discharge -Continue Colchicine 0.6 mg daily -Taper to Prednisone 20 mg on discharge  -Oxy-IR 5 mg q4h prn for pain.   URI: Most likely viral bronchitis. Patient continues to have mild wheezing on exam and dry cough.  -Continue Duonebs q4h prn -Continue supplemental oxygen -Continue Tussionex 5 ml bid -Prescribe albuterol inhaler on discharge  HTN: 180/97 this morning before meds, previously normotensive. Patient is on amlodipine 10 mg daily, carvedilol 6.25 mg BID, lasix 20 mg daily, and spironolactone at home. -Continue Carvedilol 6.25 mg bid, Lasix 20 mg daily, Spironolactone 50 mg daily  HCC with Abdominal Metastases: History of [place]  with metastases currently following a hospice physician at the [place]. Patient is DNR. -Continue Oxy-IR 5 mg q4h prn, prescribe short course on discharge -PT/OT  -Continue Lasix and Spironolactone as above.   DVT/PE PPx: SCD's  Dispo: Disposition is deferred at this time, awaiting improvement of current medical problems.  Anticipated discharge today.    The patient does not have a current PCP (No primary care provider on file.) and does need an [facility] hospital follow-up appointment after discharge.  The patient does have transportation limitations that hinder transportation to clinic appointments.  .Services Needed at time of discharge: Y = Yes, Blank = No PT:   OT:   RN:   Equipment:   Other:     LOS: 3 days   Signed: Osa Craver, DO PGY-1 Internal Medicine Resident Pager # [phone] 12/08/2014 9:59 AM

## 2014-12-08 NOTE — Discharge Instructions (Signed)
Thank you for allowing [place] to be involved in your healthcare while you were hospitalized at [facility].   Please note that there have been changes to your home medications.  --> PLEASE LOOK AT YOUR DISCHARGE MEDICATION LIST FOR DETAILS.  Please call your PCP if you have any questions or concerns, or any difficulty getting any of your medications.  Please return to the ER if you have worsening of your symptoms or new severe symptoms arise.  Please follow up with your physician at the [facility] after discharge. We have given you written copies of your prescriptions which you can fill at any pharmacy. We have given you a short course of pain medications, but you will need to follow up with your physician at the [place].  Please complete your course of antibiotics and prednisone.  Cellulitis Cellulitis is an infection of the skin and the tissue beneath it. The infected area is usually red and tender. Cellulitis occurs most often in the arms and lower legs.  CAUSES  Cellulitis is caused by bacteria that enter the skin through cracks or cuts in the skin. The most common types of bacteria that cause cellulitis are staphylococci and streptococci. SIGNS AND SYMPTOMS   Redness and warmth.  Swelling.  Tenderness or pain.  Fever. DIAGNOSIS  Your health care provider can usually determine what is wrong based on a physical exam. Blood tests may also be done. TREATMENT  Treatment usually involves taking an antibiotic medicine. HOME CARE INSTRUCTIONS   Take your antibiotic medicine as directed by your health care provider. Finish the antibiotic even if you start to feel better.  Keep the infected arm or leg elevated to reduce swelling.  Apply a warm cloth to the affected area up to 4 times per day to relieve pain.  Take medicines only as directed by your health care provider.  Keep all follow-up visits as directed by your health care provider. SEEK MEDICAL CARE IF:   You notice red  streaks coming from the infected area.  Your red area gets larger or turns dark in color.  Your bone or joint underneath the infected area becomes painful after the skin has healed.  Your infection returns in the same area or another area.  You notice a swollen bump in the infected area.  You develop new symptoms.  You have a fever. SEEK IMMEDIATE MEDICAL CARE IF:   You feel very sleepy.  You develop vomiting or diarrhea.  You have a general ill feeling (malaise) with muscle aches and pains. MAKE SURE YOU:   Understand these instructions.  Will watch your condition.  Will get help right away if you are not doing well or get worse. Document Released: 08/10/2005 Document Revised: 03/17/2014 Document Reviewed: 01/16/2012 [facility] Patient Information 2015 [place], [place]. This information is not intended to replace advice given to you by your health care provider. Make sure you discuss any questions you have with your health care provider.  Gout Gout is an inflammatory arthritis caused by a buildup of uric acid crystals in the joints. Uric acid is a chemical that is normally present in the blood. When the level of uric acid in the blood is too high it can form crystals that deposit in your joints and tissues. This causes joint redness, soreness, and swelling (inflammation). Repeat attacks are common. Over time, uric acid crystals can form into masses (tophi) near a joint, destroying bone and causing disfigurement. Gout is treatable and often preventable. CAUSES  The disease begins with elevated levels of uric acid in the blood. Uric acid is produced by your body when it breaks down a naturally found substance called purines. Certain foods you eat, such as meats and fish, contain high amounts of purines. Causes of an elevated uric acid level include:  Being passed down from parent to child (heredity).  Diseases that cause increased uric acid production (such as obesity, psoriasis, and  certain cancers).  Excessive alcohol use.  Diet, especially diets rich in meat and seafood.  Medicines, including certain cancer-fighting medicines (chemotherapy), water pills (diuretics), and aspirin.  Chronic kidney disease. The kidneys are no longer able to remove uric acid well.  Problems with metabolism. Conditions strongly associated with gout include:  Obesity.  High blood pressure.  High cholesterol.  Diabetes. Not everyone with elevated uric acid levels gets gout. It is not understood why some people get gout and others do not. Surgery, joint injury, and eating too much of certain foods are some of the factors that can lead to gout attacks. SYMPTOMS   An attack of gout comes on quickly. It causes intense pain with redness, swelling, and warmth in a joint.  Fever can occur.  Often, only one joint is involved. Certain joints are more commonly involved:  Base of the big toe.  Knee.  Ankle.  Wrist.  Finger. Without treatment, an attack usually goes away in a few days to weeks. Between attacks, you usually will not have symptoms, which is different from many other forms of arthritis. DIAGNOSIS  Your caregiver will suspect gout based on your symptoms and exam. In some cases, tests may be recommended. The tests may include:  Blood tests.  Urine tests.  X-rays.  Joint fluid exam. This exam requires a needle to remove fluid from the joint (arthrocentesis). Using a microscope, gout is confirmed when uric acid crystals are seen in the joint fluid. TREATMENT  There are two phases to gout treatment: treating the sudden onset (acute) attack and preventing attacks (prophylaxis).  Treatment of an Acute Attack.  Medicines are used. These include anti-inflammatory medicines or steroid medicines.  An injection of steroid medicine into the affected joint is sometimes necessary.  The painful joint is rested. Movement can worsen the arthritis.  You may use warm or cold  treatments on painful joints, depending which works best for you.  Treatment to Prevent Attacks.  If you suffer from frequent gout attacks, your caregiver may advise preventive medicine. These medicines are started after the acute attack subsides. These medicines either help your kidneys eliminate uric acid from your body or decrease your uric acid production. You may need to stay on these medicines for a very long time.  The early phase of treatment with preventive medicine can be associated with an increase in acute gout attacks. For this reason, during the first few months of treatment, your caregiver may also advise you to take medicines usually used for acute gout treatment. Be sure you understand your caregiver's directions. Your caregiver may make several adjustments to your medicine dose before these medicines are effective.  Discuss dietary treatment with your caregiver or dietitian. Alcohol and drinks high in sugar and fructose and foods such as meat, poultry, and seafood can increase uric acid levels. Your caregiver or dietitian can advise you on drinks and foods that should be limited. HOME CARE INSTRUCTIONS   Do not take aspirin to relieve pain. This raises uric acid levels.  Only take over-the-counter or prescription medicines for  pain, discomfort, or fever as directed by your caregiver.  Rest the joint as much as possible. When in bed, keep sheets and blankets off painful areas.  Keep the affected joint raised (elevated).  Apply warm or cold treatments to painful joints. Use of warm or cold treatments depends on which works best for you.  Use crutches if the painful joint is in your leg.  Drink enough fluids to keep your urine clear or pale yellow. This helps your body get rid of uric acid. Limit alcohol, sugary drinks, and fructose drinks.  Follow your dietary instructions. Pay careful attention to the amount of protein you eat. Your daily diet should emphasize fruits,  vegetables, whole grains, and fat-free or low-fat milk products. Discuss the use of coffee, vitamin C, and cherries with your caregiver or dietitian. These may be helpful in lowering uric acid levels.  Maintain a healthy body weight. SEEK MEDICAL CARE IF:   You develop diarrhea, vomiting, or any side effects from medicines.  You do not feel better in 24 hours, or you are getting worse. SEEK IMMEDIATE MEDICAL CARE IF:   Your joint becomes suddenly more tender, and you have chills or a fever. MAKE SURE YOU:   Understand these instructions.  Will watch your condition.  Will get help right away if you are not doing well or get worse. Document Released: 10/28/2000 Document Revised: 03/17/2014 Document Reviewed: 06/13/2012 [facility] Patient Information 2015 [place], [place]. This information is not intended to replace advice given to you by your health care provider. Make sure you discuss any questions you have with your health care provider.

## 2014-12-08 NOTE — Progress Notes (Signed)
Nutrition Brief Note  Patient identified on the Malnutrition Screening Tool (MST) Report  Wt Readings from Last 15 Encounters:  12/07/14 242 lb 4.8 oz (109.907 kg)  08/08/14 245 lb (111.131 kg)  09/23/11 238 lb (107.956 kg)    Body mass index is 32.85 kg/(m^2). Patient meets criteria for Obesity based on current BMI.   Current diet order is Heart Healthy/Low Sodium, patient is consuming approximately 100% of meals at this time. Pt states his appetite has improved and he is eating well. Labs and medications reviewed.   No nutrition interventions warranted at this time. If nutrition issues arise, please consult RD.   Pryor Ochoa RD, LDN Inpatient Clinical Dietitian Pager: [phone] After Hours Pager: [phone]

## 2015-06-15 DEATH — deceased

## 2015-07-31 IMAGING — DX DG CHEST 2V
2 series · 2 of 2 positions shown · non-contrast
Comparison: 09/23/2011.

CLINICAL DATA: Cough.  Shortness of breath.

EXAM:
CHEST  2 VIEW

[chest lat]
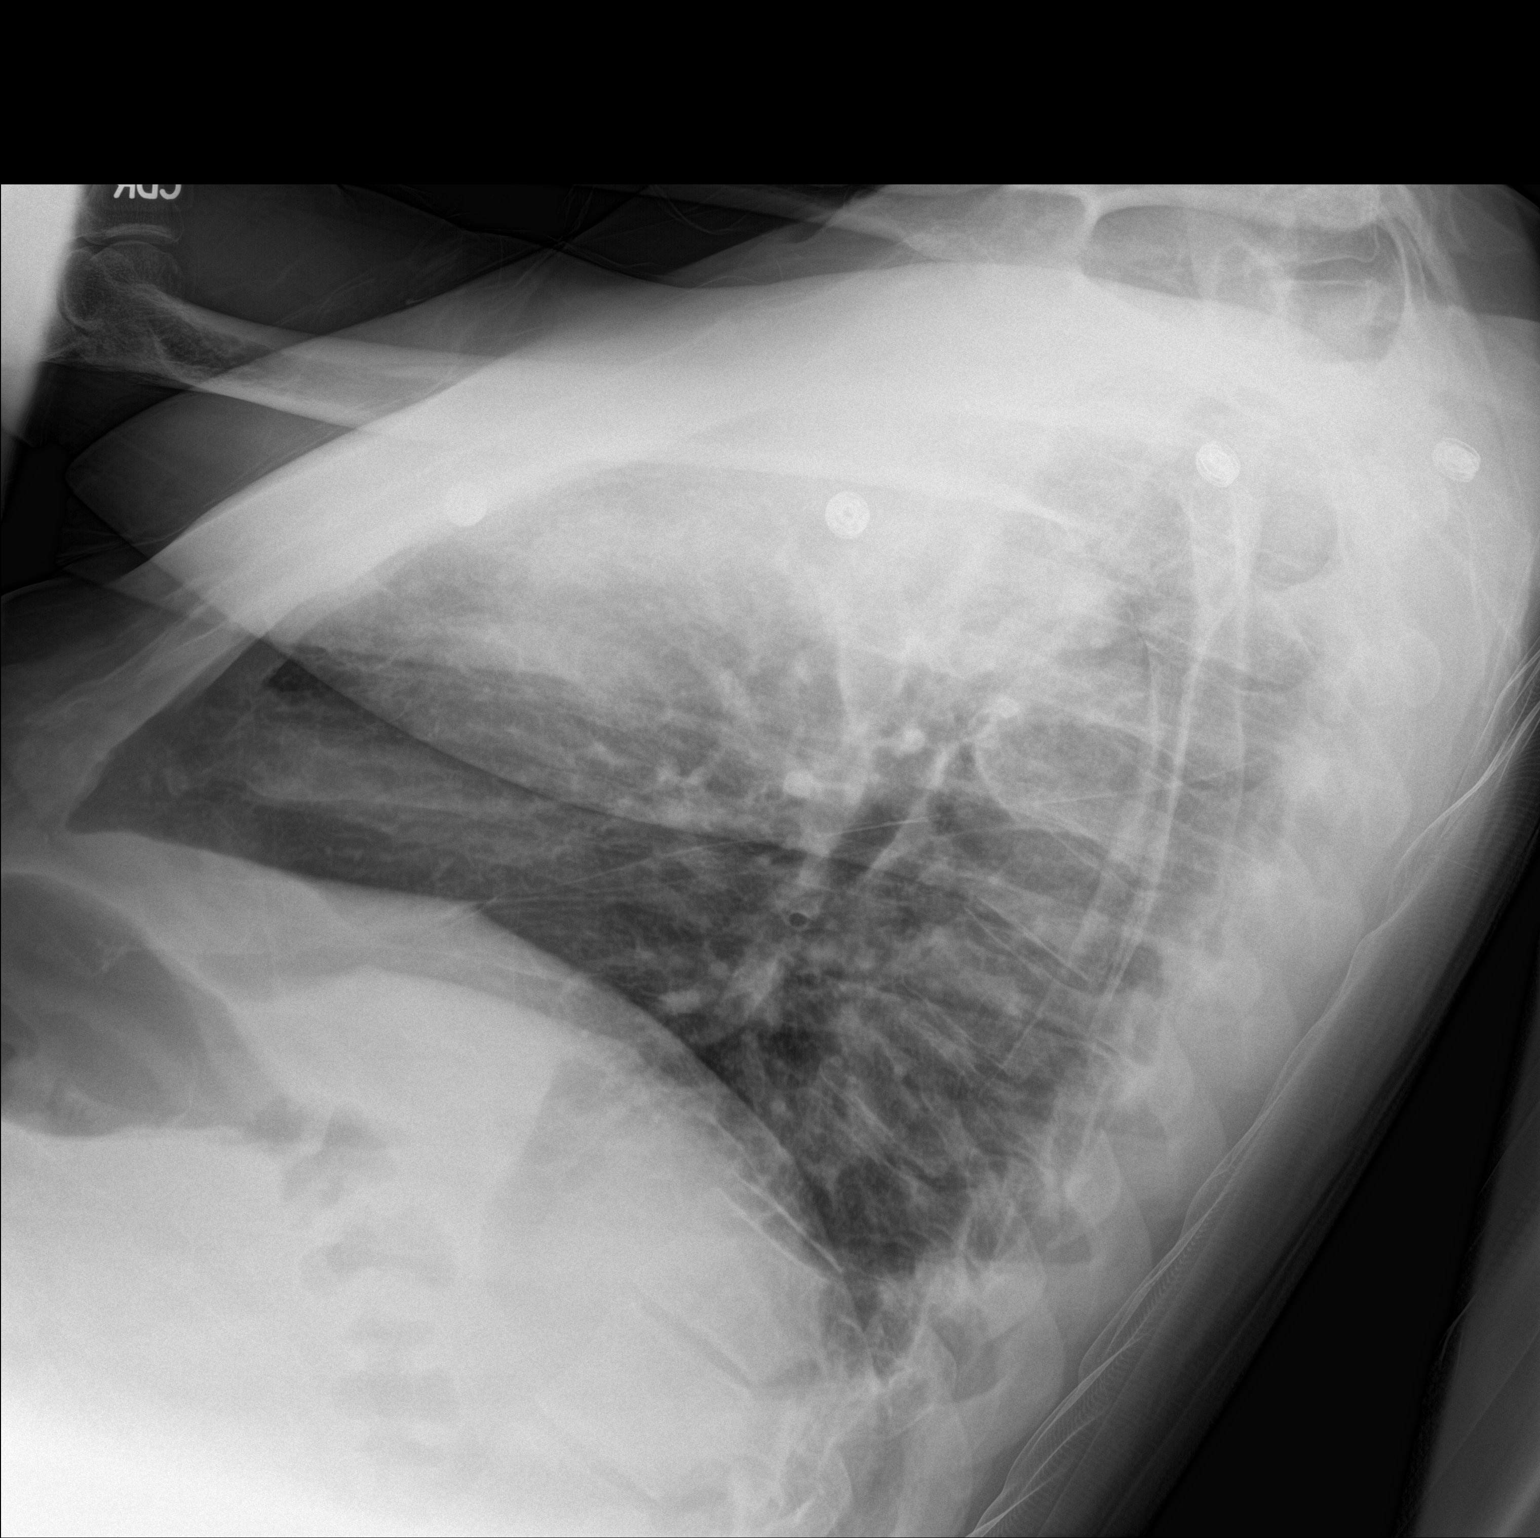

[chest ap]
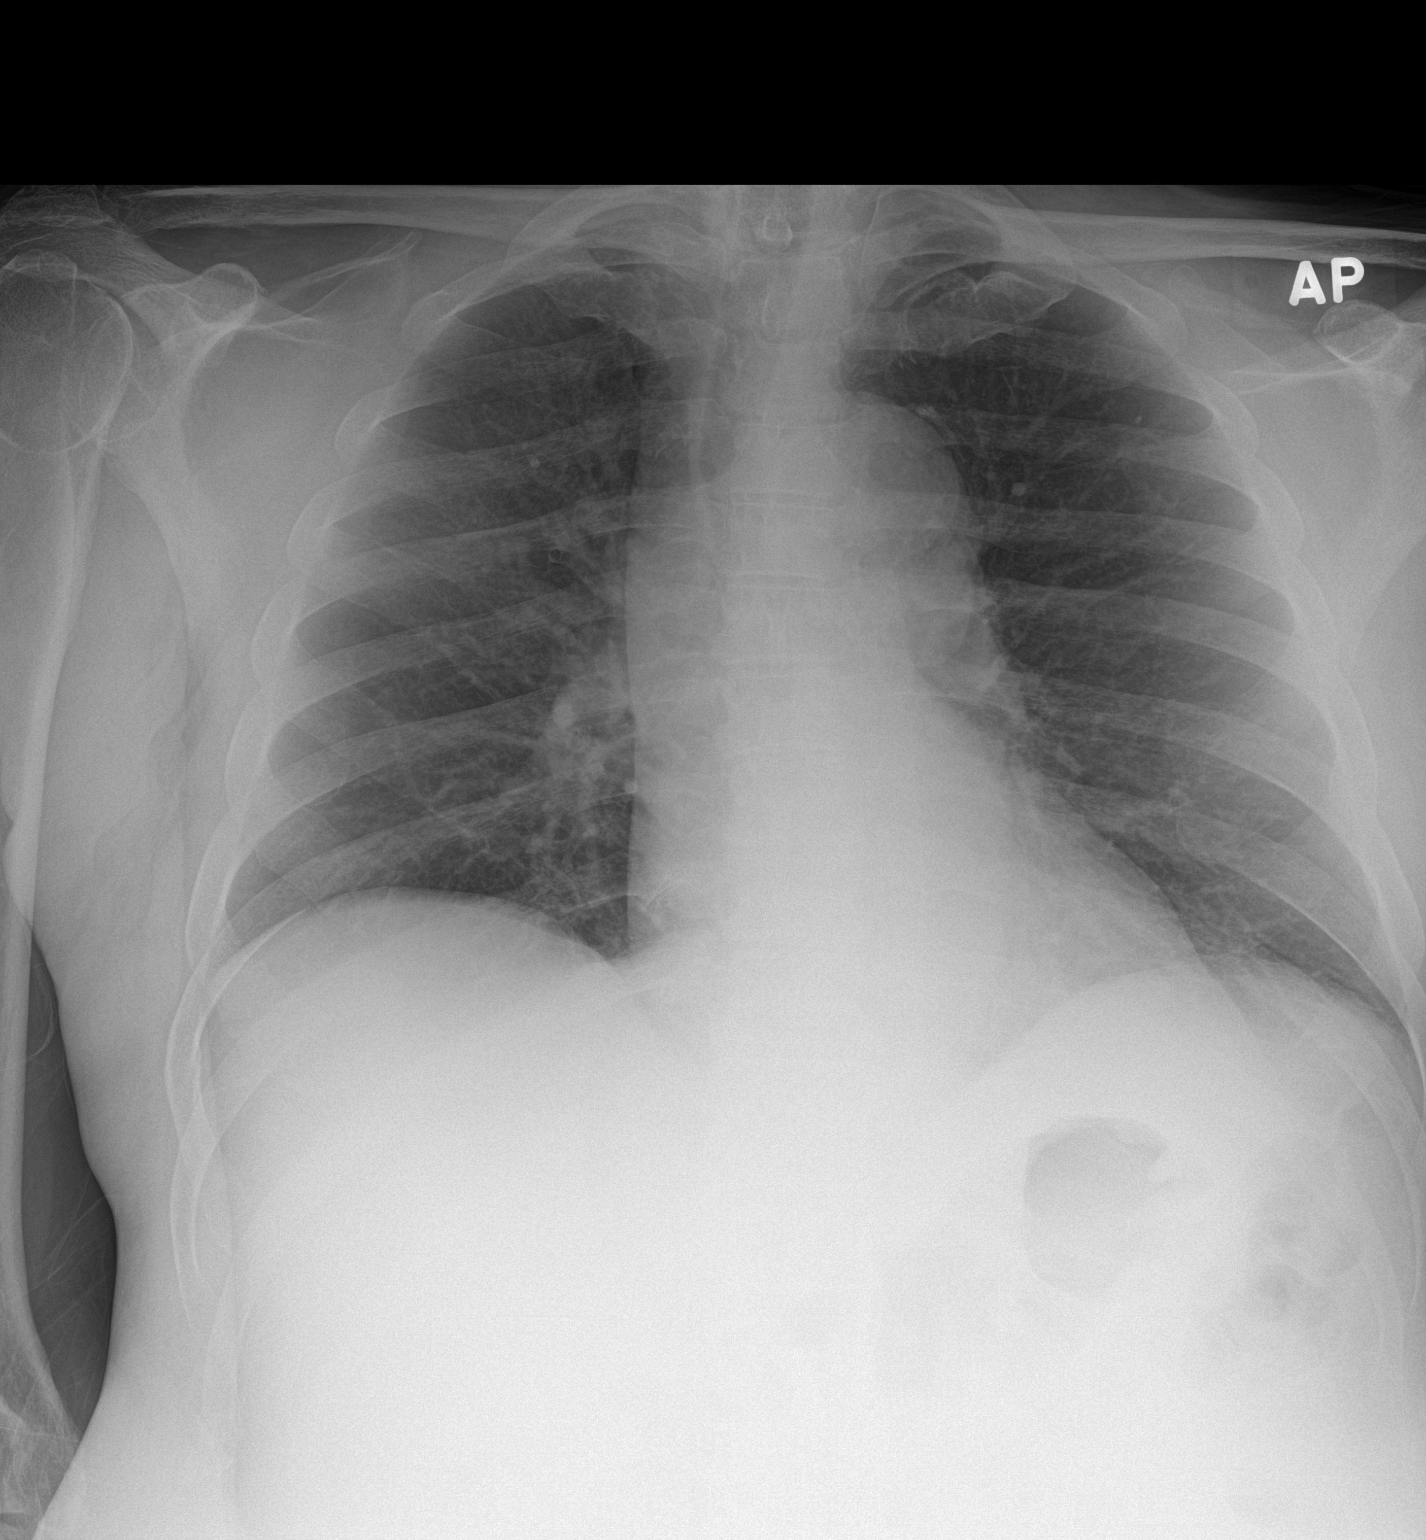

[2 of 2 positions shown; findings below may reference images not displayed]

FINDINGS: Mediastinum and hilar structures normal. Minimal infiltrate right
upper lobe cannot be excluded . Mild cardiomegaly. No CHF. No
pleural effusion or pneumothorax. No acute bony abnormality.
IMPRESSION: Minimal infiltrate right upper lobe cannot be excluded.
